# Patient Record
Sex: Female | Born: 2000 | Race: White | Hispanic: No | Marital: Single | Smoking: Never smoker
Health system: Southern US, Community
[De-identification: ages and names within clinical notes are randomized; demographics above are authoritative.]

## PROBLEM LIST (undated history)

## (undated) ENCOUNTER — Inpatient Hospital Stay (HOSPITAL_COMMUNITY): Payer: Self-pay

## (undated) DIAGNOSIS — J039 Acute tonsillitis, unspecified: Secondary | ICD-10-CM

## (undated) DIAGNOSIS — Z3490 Encounter for supervision of normal pregnancy, unspecified, unspecified trimester: Secondary | ICD-10-CM

## (undated) HISTORY — PX: TONSILLECTOMY: SUR1361

---
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
## 2018-01-06 ENCOUNTER — Other Ambulatory Visit (HOSPITAL_COMMUNITY)
Admission: RE | Admit: 2018-01-06 | Discharge: 2018-01-06 | Disposition: A | Discharge status: Home / Self Care | Payer: Medicaid Other | Source: Ambulatory Visit | Attending: Certified Nurse Midwife | Admitting: Certified Nurse Midwife

## 2018-01-06 ENCOUNTER — Ambulatory Visit (INDEPENDENT_AMBULATORY_CARE_PROVIDER_SITE_OTHER): Payer: Medicaid Other | Admitting: Certified Nurse Midwife

## 2018-01-06 ENCOUNTER — Encounter: Payer: Self-pay | Admitting: Certified Nurse Midwife

## 2018-01-06 VITALS — BP 120/76 | HR 86 | Wt 157.0 lb

## 2018-01-06 DIAGNOSIS — Z3A35 35 weeks gestation of pregnancy: Secondary | ICD-10-CM

## 2018-01-06 DIAGNOSIS — Z3483 Encounter for supervision of other normal pregnancy, third trimester: Secondary | ICD-10-CM

## 2018-01-06 DIAGNOSIS — Z3480 Encounter for supervision of other normal pregnancy, unspecified trimester: Secondary | ICD-10-CM | POA: Insufficient documentation

## 2018-01-06 NOTE — Patient Instructions (Signed)
Reasons to go to [facility]U:  1.  Contractions are  4-5 minutes apart or less, each last 1 minute, these have been going on for 1-2 hours, and you cannot walk or talk during them 2.  You have a large gush of fluid, or a trickle of fluid that will not stop and you have to wear a pad 3.  You have bleeding that is bright red, heavier than spotting--like menstrual bleeding (spotting can be normal in early labor or after a check of your cervix) 4.  You do not feel the baby moving like he/she normally does  

## 2018-01-06 NOTE — Progress Notes (Signed)
   PRENATAL VISIT NOTE  Subjective:  Barbara Kennedy is a 17 y.o. G2P1001 at 5969w6d being seen today for ongoing prenatal care.  She is currently monitored for the following issues for this low-risk pregnancy and has Encounter for supervision of normal pregnancy, unspecified, unspecified trimester and MDD (major depressive disorder), single episode, severe , no psychosis (HCC) on their problem list.  Patient reports no complaints.  Contractions: Not present. Vag. Bleeding: None.  Movement: Present. Denies leaking of fluid.   The following portions of the patient's history were reviewed and updated as appropriate: allergies, current medications, past family history, past medical history, past social history, past surgical history and problem list. Problem list updated.  Objective:   Vitals:   01/06/18 1127  BP: 120/76  Pulse: 86  Weight: 157 lb (71.2 kg)    Fetal Status: Fetal Heart Rate (bpm): 140 Fundal Height: 33 cm Movement: Present  Presentation: Vertex  General:  Alert, oriented and cooperative. Patient is in no acute distress.  Skin: Skin is warm and dry. No rash noted.   Cardiovascular: Normal heart rate noted  Respiratory: Normal respiratory effort, no problems with respiration noted  Abdomen: Soft, gravid, appropriate for gestational age.  Pain/Pressure: Present     Pelvic: Cervical exam performed Dilation: 3 Effacement (%): Thick Station: -2  Extremities: Normal range of motion.     Mental Status: Normal mood and affect. Normal behavior. Normal judgment and thought content.   Assessment and Plan:  Pregnancy: G2P1001 at 7069w6d  1. Supervision of other normal pregnancy, antepartum - patient doing well, no complaints - Anticipatory guidance on upcoming appointments  - Labor precautions discussed in detail  - Strep Gp B NAA - Cervicovaginal ancillary only( [place]) - US MFM OB FOLLOW UP; Future  Preterm labor symptoms and general obstetric precautions including but  not limited to vaginal bleeding, contractions, leaking of fluid and fetal movement were reviewed in detail with the patient. Please refer to After Visit Summary for other counseling recommendations.  Return in about 1 week (around 01/13/2018) for ROB.  Future Appointments  Date Time Provider Department Center  01/13/2018 10:30 AM Sharyon Cableogers, Errin Chewning C, CNM [facility] None  01/14/2018  1:15 PM [facility] US 4 [facility]    Sharyon CableVeronica C Franchelle Foskett, CNM

## 2018-01-07 LAB — CERVICOVAGINAL ANCILLARY ONLY
Chlamydia: NEGATIVE
Neisseria Gonorrhea: NEGATIVE

## 2018-01-08 LAB — STREP GP B NAA: Strep Gp B NAA: NEGATIVE

## 2018-01-09 ENCOUNTER — Inpatient Hospital Stay (HOSPITAL_COMMUNITY)
Admission: AD | Admit: 2018-01-09 | Discharge: 2018-01-09 | Disposition: A | Discharge status: Home / Self Care | Payer: Medicaid Other | Source: Ambulatory Visit | Attending: Obstetrics & Gynecology | Admitting: Obstetrics & Gynecology

## 2018-01-09 ENCOUNTER — Encounter (HOSPITAL_COMMUNITY): Payer: Self-pay | Admitting: *Deleted

## 2018-01-09 DIAGNOSIS — O99613 Diseases of the digestive system complicating pregnancy, third trimester: Secondary | ICD-10-CM | POA: Insufficient documentation

## 2018-01-09 DIAGNOSIS — R109 Unspecified abdominal pain: Secondary | ICD-10-CM

## 2018-01-09 DIAGNOSIS — Z3A36 36 weeks gestation of pregnancy: Secondary | ICD-10-CM | POA: Diagnosis not present

## 2018-01-09 DIAGNOSIS — R12 Heartburn: Secondary | ICD-10-CM | POA: Diagnosis not present

## 2018-01-09 DIAGNOSIS — O26893 Other specified pregnancy related conditions, third trimester: Secondary | ICD-10-CM | POA: Diagnosis not present

## 2018-01-09 DIAGNOSIS — O212 Late vomiting of pregnancy: Secondary | ICD-10-CM | POA: Diagnosis present

## 2018-01-09 DIAGNOSIS — R110 Nausea: Secondary | ICD-10-CM

## 2018-01-09 LAB — URINALYSIS, ROUTINE W REFLEX MICROSCOPIC
BILIRUBIN URINE: NEGATIVE
GLUCOSE, UA: NEGATIVE mg/dL
Hgb urine dipstick: NEGATIVE
Ketones, ur: NEGATIVE mg/dL
Nitrite: NEGATIVE
PROTEIN: NEGATIVE mg/dL
Specific Gravity, Urine: 1.026 (ref 1.005–1.030)
pH: 6 (ref 5.0–8.0)

## 2018-01-09 MED ORDER — ONDANSETRON 8 MG PO TBDP
8.0000 mg | ORAL_TABLET | Freq: Once | ORAL | Status: AC
  Administered 2018-01-09: 8 mg via ORAL
  Filled 2018-01-09: qty 1

## 2018-01-09 MED ORDER — PANTOPRAZOLE SODIUM 40 MG PO TBEC: 40.0000 mg | DELAYED_RELEASE_TABLET | Freq: Every day | ORAL | 0 refills | Status: DC

## 2018-01-09 MED ORDER — ONDANSETRON 4 MG PO TBDP: 4.0000 mg | ORAL_TABLET | Freq: Three times a day (TID) | ORAL | 0 refills | Status: DC | PRN

## 2018-01-09 NOTE — Discharge Instructions (Signed)
Heartburn During Pregnancy °Heartburn is a type of pain or discomfort that can happen in the throat or chest. It is often described as a burning sensation. Heartburn is common during pregnancy because: °· A hormone (progesterone) that is released during pregnancy may relax the valve (lower esophageal sphincter, or LES) that separates the esophagus from the stomach. This allows stomach acid to move up into the esophagus, causing heartburn. °· The uterus gets larger and pushes up on the stomach, which pushes more acid into the esophagus. This is especially true in the later stages of pregnancy. ° °Heartburn usually goes away or gets better after giving birth. °What are the causes? °Heartburn is caused by stomach acid backing up into the esophagus (reflux). Reflux can be triggered by: °· Changing hormone levels. °· Large meals. °· Certain foods and beverages, such as coffee, chocolate, onions, and peppermint. °· Exercise. °· Increased stomach acid production. ° °What increases the risk? °You are more likely to experience heartburn during pregnancy if you: °· Had heartburn prior to becoming pregnant. °· Have been pregnant more than once before. °· Are overweight or obese. ° °The likelihood that you will get heartburn also increases as you get farther along in your pregnancy, especially during the last trimester. °What are the signs or symptoms? °Symptoms of this condition include: °· Burning pain in the chest or lower throat. °· Bitter taste in the mouth. °· Coughing. °· Problems swallowing. °· Vomiting. °· Hoarse voice. °· Asthma. ° °Symptoms may get worse when you lie down or bend over. Symptoms are often worse at night. °How is this diagnosed? °This condition is diagnosed based on: °· Your medical history. °· Your symptoms. °· Blood tests to check for a certain type of bacteria associated with heartburn. °· Whether taking heartburn medicine relieves your symptoms. °· Examination of the stomach and esophagus using a  tube with a light and camera on the end (endoscopy). ° °How is this treated? °Treatment varies depending on how severe your symptoms are. Your health care provider may recommend: °· Over-the-counter medicines (antacids or acid reducers) for mild heartburn. °· Prescription medicines to decrease stomach acid or to protect your stomach lining. °· Certain changes in your diet. °· Raising the head of your bed so it is higher than the foot of the bed. This helps prevent stomach acid from backing up into the esophagus when you are lying down. ° °Follow these instructions at home: °Eating and drinking °· Do not drink alcohol during your pregnancy. °· Identify foods and beverages that make your symptoms worse, and avoid them. °· Beverages that you may want to avoid include: °? Coffee and tea (with or without caffeine). °? Energy drinks and sports drinks. °? Carbonated drinks or sodas. °? Citrus fruit juices. °· Foods that you may want to avoid include: °? Chocolate and cocoa. °? Peppermint and mint flavorings. °? Garlic, onions, and horseradish. °? Spicy and acidic foods, including peppers, chili powder, curry powder, vinegar, hot sauces, and barbecue sauce. °? Citrus fruits, such as oranges, lemons, and limes. °? Tomato-based foods, such as red sauce, chili, and salsa. °? Fried and fatty foods, such as donuts, french fries, potato chips, and high-fat dressings. °? High-fat meats, such as hot dogs, cold cuts, sausage, ham, and bacon. °? High-fat dairy items, such as whole milk, butter, and cheese. °· Eat small, frequent meals instead of large meals. °· Avoid drinking large amounts of liquid with your meals. °· Avoid eating meals during the 2-3 hours before   bedtime. °· Avoid lying down right after you eat. °· Do not exercise right after you eat. °Medicines °· Take over-the-counter and prescription medicines only as told by your health care provider. °· Do not take aspirin, ibuprofen, or other NSAIDs unless your health care  provider tells you to do that. °· You may be instructed to avoid medicines that contain sodium bicarbonate. °General instructions °· If directed, raise the head of your bed about 6 inches (15 cm) by putting blocks under the legs. Sleeping with more pillows does not effectively relieve heartburn because it only changes the position of your head. °· Do not use any products that contain nicotine or tobacco, such as cigarettes and e-cigarettes. If you need help quitting, ask your health care provider. °· Wear loose-fitting clothing. °· Try to reduce your stress, such as with yoga or meditation. If you need help managing stress, ask your health care provider. °· Maintain a healthy weight. If you are overweight, work with your health care provider to safely lose weight. °· Keep all follow-up visits as told by your health care provider. This is important. °Contact a health care provider if: °· You develop new symptoms. °· Your symptoms do not improve with treatment. °· You have unexplained weight loss. °· You have difficulty swallowing. °· You make loud sounds when you breathe (wheeze). °· You have a cough that does not go away. °· You have frequent heartburn for more than 2 weeks. °· You have nausea or vomiting that does not get better with treatment. °· You have pain in your abdomen. °Get help right away if: °· You have severe chest pain that spreads to your arm, neck, or jaw. °· You feel sweaty, dizzy, or light-headed. °· You have shortness of breath. °· You have pain when swallowing. °· You vomit, and your vomit looks like blood or coffee grounds. °· Your stool is bloody or black. °This information is not intended to replace advice given to you by your health care provider. Make sure you discuss any questions you have with your health care provider. °Document Released: 01/17/2000 Document Revised: 10/07/2015 Document Reviewed: 10/07/2015 °Elsevier Interactive Patient Education © 2018 [employer] ° °

## 2018-01-09 NOTE — MAU Note (Signed)
Pt here with c/o nausea and vomiting the last two days; upper abdominal cramping. Denies any bleeding or leaking. Reports good fetal movement.

## 2018-01-09 NOTE — MAU Provider Note (Signed)
Chief Complaint:  Nausea and Abdominal Pain   First Provider Initiated Contact with Patient 01/09/18 2028     HPI: Barbara Kennedy is a 17 y.o. G2P1001 at 853w2d who presents to maternity admissions reporting n/v. Symptoms began a few days ago. Reports feeling epigastric burning d/t being hungry, eats, then vomits. Able to keep down fluids. Denies abdominal pain, vaginal bleeding, LOF, fever, or diarrhea. Normal fetal movement. Hasn't treated symptoms. N/v improved PTA without intervention.   Past Medical History:  Diagnosis Date  . Tonsillitis    OB History  Gravida Para Term Preterm AB Living  2 1 1     1   SAB TAB Ectopic Multiple Live Births          1    # Outcome Date GA Lbr Len/2nd Weight Sex Delivery Anes PTL Lv  2 Current           1 Term 10/27/16 7355w0d  3430 g F Vag-Spont   LIV   Past Surgical History:  Procedure Laterality Date  . TONSILLECTOMY     Family History  Problem Relation Age of Onset  . Diabetes Maternal Grandmother    Social History   Tobacco Use  . Smoking status: Never Smoker  . Smokeless tobacco: Never Used  Substance Use Topics  . Alcohol use: No  . Drug use: No   Allergies  Allergen Reactions  . Citric Acid Rash    Around mouth, chin area   Medications Prior to Admission  Medication Sig Dispense Refill Last Dose  . Prenatal MV-Min-FA-Omega-3 (PRENATAL GUMMIES/DHA & FA PO) Take 2 capsules by mouth daily.    01/09/2018 at Unknown time    I have reviewed patient's Past Medical Hx, Surgical Hx, Family Hx, Social Hx, medications and allergies.   ROS:  Review of Systems  Constitutional: Negative.   Gastrointestinal: Positive for abdominal pain (epigastric burning on empty stomach), nausea and vomiting. Negative for constipation and diarrhea.  Genitourinary: Negative.     Physical Exam   Patient Vitals for the past 24 hrs:  BP Temp Temp src Pulse Resp SpO2 Height Weight  01/09/18 2156 119/64 - - 83 16 - - -  01/09/18 1930 126/85 98 F  (36.7 C) Oral 81 18 100 % 5\' 1"  (1.549 m) 68.5 kg    Constitutional: Well-developed, well-nourished female in no acute distress.  Cardiovascular: normal rate & rhythm, no murmur Respiratory: normal effort, lung sounds clear throughout GI: Abd soft, non-tender, gravid appropriate for gestational age. Pos BS x 4 MS: Extremities nontender, no edema, normal ROM Neurologic: Alert and oriented x 4.   NST:  Baseline: 130 bpm, Variability: Good {> 6 bpm), Accelerations: Reactive and Decelerations: Absent   Labs: Results for orders placed or performed during the hospital encounter of 01/09/18 (from the past 24 hour(s))  Urinalysis, Routine w reflex microscopic     Status: Abnormal   Collection Time: 01/09/18  7:38 PM  Result Value Ref Range   Color, Urine AMBER (A) YELLOW   APPearance CLEAR CLEAR   Specific Gravity, Urine 1.026 1.005 - 1.030   pH 6.0 5.0 - 8.0   Glucose, UA NEGATIVE NEGATIVE mg/dL   Hgb urine dipstick NEGATIVE NEGATIVE   Bilirubin Urine NEGATIVE NEGATIVE   Ketones, ur NEGATIVE NEGATIVE mg/dL   Protein, ur NEGATIVE NEGATIVE mg/dL   Nitrite NEGATIVE NEGATIVE   Leukocytes, UA TRACE (A) NEGATIVE   RBC / HPF 0-5 0 - 5 RBC/hpf   WBC, UA 0-5 0 - 5  WBC/hpf   Bacteria, UA RARE (A) NONE SEEN   Squamous Epithelial / LPF 0-5 0 - 5   Mucus PRESENT     Imaging:  No results found.  MAU Course: Orders Placed This Encounter  Procedures  . Urinalysis, Routine w reflex microscopic  . Discharge patient   Meds ordered this encounter  Medications  . ondansetron (ZOFRAN-ODT) disintegrating tablet 8 mg  . pantoprazole (PROTONIX) 40 MG tablet    Sig: Take 1 tablet (40 mg total) by mouth daily.    Dispense:  30 tablet    Refill:  0    Order Specific Question:   Supervising Provider    Answer:   Despina Hidden, LUTHER H [[id number]]  . ondansetron (ZOFRAN ODT) 4 MG disintegrating tablet    Sig: Take 1 tablet (4 mg total) by mouth every 8 (eight) hours as needed for nausea or vomiting.     Dispense:  15 tablet    Refill:  0    Order Specific Question:   Supervising Provider    Answer:   Lazaro Arms [[id number]]    MDM: Zofran given. Pt able to tolerate POs. Will discharge home with meds.   Assessment: 1. Heartburn during pregnancy in third trimester   2. [redacted] weeks gestation of pregnancy     Plan: Discharge home in stable condition.  Labor precautions and fetal kick counts Rx protonix & zofran   Allergies as of 01/09/2018      Reactions   Citric Acid Rash   Around mouth, chin area      Medication List    TAKE these medications   ondansetron 4 MG disintegrating tablet Commonly known as:  ZOFRAN-ODT Take 1 tablet (4 mg total) by mouth every 8 (eight) hours as needed for nausea or vomiting.   pantoprazole 40 MG tablet Commonly known as:  PROTONIX Take 1 tablet (40 mg total) by mouth daily.   PRENATAL GUMMIES/DHA & FA PO Take 2 capsules by mouth daily.       Barbara Horn, NP 01/09/2018 9:51 PM

## 2018-01-13 ENCOUNTER — Ambulatory Visit (INDEPENDENT_AMBULATORY_CARE_PROVIDER_SITE_OTHER): Payer: Medicaid Other | Admitting: Certified Nurse Midwife

## 2018-01-13 ENCOUNTER — Encounter: Payer: Self-pay | Admitting: Certified Nurse Midwife

## 2018-01-13 VITALS — BP 114/74 | HR 73 | Wt 154.2 lb

## 2018-01-13 DIAGNOSIS — Z3480 Encounter for supervision of other normal pregnancy, unspecified trimester: Secondary | ICD-10-CM

## 2018-01-13 DIAGNOSIS — Z3483 Encounter for supervision of other normal pregnancy, third trimester: Secondary | ICD-10-CM

## 2018-01-13 NOTE — Progress Notes (Signed)
   PRENATAL VISIT NOTE  Subjective:  Barbara Kennedy is a 17 y.o. G2P1001 at [redacted]w[redacted]d being seen today for ongoing prenatal care.  She is currently monitored for the following issues for this low-risk pregnancy and has Encounter for supervision of normal pregnancy, unspecified, unspecified trimester and MDD (major depressive disorder), single episode, severe , no psychosis (HCC) on their problem list.  Patient reports no complaints.  Contractions: Not present. Vag. Bleeding: None.  Movement: Present. Denies leaking of fluid.   The following portions of the patient's history were reviewed and updated as appropriate: allergies, current medications, past family history, past medical history, past social history, past surgical history and problem list. Problem list updated.  Objective:   Vitals:   01/13/18 1029  BP: 114/74  Pulse: 73  Weight: 154 lb 3.2 oz (69.9 kg)    Fetal Status: Fetal Heart Rate (bpm): 150 Fundal Height: 33 cm Movement: Present     General:  Alert, oriented and cooperative. Patient is in no acute distress.  Skin: Skin is warm and dry. No rash noted.   Cardiovascular: Normal heart rate noted  Respiratory: Normal respiratory effort, no problems with respiration noted  Abdomen: Soft, gravid, appropriate for gestational age.  Pain/Pressure: Absent     Pelvic: Cervical exam deferred        Extremities: Normal range of motion.  Edema: None  Mental Status: Normal mood and affect. Normal behavior. Normal judgment and thought content.   Assessment and Plan:  Pregnancy: G2P1001 at [redacted]w[redacted]d  1. Supervision of other normal pregnancy, antepartum - Patient doing well, no complaints - Anticipatory guidance on upcoming appointments   2. Small for gestational age (SGA) - SGA vs IUGR, watch FH closely  - Follow up US scheduled for 12/13   Term labor symptoms and general obstetric precautions including but not limited to vaginal bleeding, contractions, leaking of fluid and fetal  movement were reviewed in detail with the patient. Please refer to After Visit Summary for other counseling recommendations.  Return in about 1 week (around 01/20/2018) for ROB.  Future Appointments  Date Time Provider Department Center  01/14/2018  1:15 PM [facility] US 4 [facility]  01/20/2018  9:30 AM Sharyon Cableogers, Breshay Ilg C, CNM [facility] None    Sharyon CableVeronica C Jamol Ginyard, CNM

## 2018-01-13 NOTE — Patient Instructions (Signed)
Reasons to go to MAU:  1.  Contractions are  5 minutes apart or less, each last 1 minute, these have been going on for 1-2 hours, and you cannot walk or talk during them 2.  You have a large gush of fluid, or a trickle of fluid that will not stop and you have to wear a pad 3.  You have bleeding that is bright red, heavier than spotting--like menstrual bleeding (spotting can be normal in early labor or after a check of your cervix) 4.  You do not feel the baby moving like he/she normally does  

## 2018-01-13 NOTE — Progress Notes (Signed)
Pt is here for ROB G2P1 [redacted]w[redacted]d.  

## 2018-01-14 ENCOUNTER — Ambulatory Visit (HOSPITAL_COMMUNITY)
Admission: RE | Admit: 2018-01-14 | Discharge: 2018-01-14 | Disposition: A | Discharge status: Home / Self Care | Payer: Medicaid Other | Source: Ambulatory Visit | Attending: Certified Nurse Midwife | Admitting: Certified Nurse Midwife

## 2018-01-14 DIAGNOSIS — O26843 Uterine size-date discrepancy, third trimester: Secondary | ICD-10-CM

## 2018-01-14 DIAGNOSIS — Z3480 Encounter for supervision of other normal pregnancy, unspecified trimester: Secondary | ICD-10-CM

## 2018-01-14 DIAGNOSIS — Z3A37 37 weeks gestation of pregnancy: Secondary | ICD-10-CM | POA: Diagnosis not present

## 2018-01-14 DIAGNOSIS — Z3483 Encounter for supervision of other normal pregnancy, third trimester: Secondary | ICD-10-CM | POA: Insufficient documentation

## 2018-01-14 NOTE — Progress Notes (Signed)
Subjective: Barbara Kennedy is a G2P1001 at 6150w0d who [facility] for ob [facility].  She does not have a history of any mental health concerns. She is currently sexually active. She is currently using no method for birth control. She has had recent STD screening on 01/06/18 and 11/12/2017 and was tested for HIV, GC and Chlamydia  Patient states family as her support system.   BP 114/74   Pulse 73   Wt 154 lb 3.2 oz (69.9 kg)   LMP 04/30/2017   BMI 29.14 kg/m   Birth Control History:  None   MDM Patient counseled on all options for birth control today including LARC. Patient desires PP IUD initiated for birth control.   Assessment:  17 y.o. female considering PP IUD for birth control  Plan: Patient will receive PP IUD before discharge  Gwyndolyn SaxonFigueroa, Kaylianna Detert, Alexander MtLCSW 01/14/2018 9:41 AM

## 2018-01-20 ENCOUNTER — Encounter: Payer: Self-pay | Admitting: Certified Nurse Midwife

## 2018-01-20 ENCOUNTER — Ambulatory Visit (INDEPENDENT_AMBULATORY_CARE_PROVIDER_SITE_OTHER): Payer: Medicaid Other | Admitting: Certified Nurse Midwife

## 2018-01-20 VITALS — BP 123/83 | HR 99 | Wt 156.8 lb

## 2018-01-20 DIAGNOSIS — Z3480 Encounter for supervision of other normal pregnancy, unspecified trimester: Secondary | ICD-10-CM

## 2018-01-20 DIAGNOSIS — Z3483 Encounter for supervision of other normal pregnancy, third trimester: Secondary | ICD-10-CM

## 2018-01-20 NOTE — Patient Instructions (Signed)
Reasons to go to MAU:  1.  Contractions are  5 minutes apart or less, each last 1 minute, these have been going on for 1-2 hours, and you cannot walk or talk during them 2.  You have a large gush of fluid, or a trickle of fluid that will not stop and you have to wear a pad 3.  You have bleeding that is bright red, heavier than spotting--like menstrual bleeding (spotting can be normal in early labor or after a check of your cervix) 4.  You do not feel the baby moving like he/she normally does  

## 2018-01-20 NOTE — Progress Notes (Signed)
   PRENATAL VISIT NOTE  Subjective:  Barbara Kennedy is a 17 y.o. G2P1001 at 188w6d being seen today for ongoing prenatal care.  She is currently monitored for the following issues for this low-risk pregnancy and has Encounter for supervision of normal pregnancy, unspecified, unspecified trimester and MDD (major depressive disorder), single episode, severe , no psychosis (HCC) on their problem list.  Patient reports no complaints.  Contractions: Not present. Vag. Bleeding: None.  Movement: Present. Denies leaking of fluid.   The following portions of the patient's history were reviewed and updated as appropriate: allergies, current medications, past family history, past medical history, past social history, past surgical history and problem list. Problem list updated.  Objective:   Vitals:   01/20/18 0942  BP: 123/83  Pulse: 99  Weight: 156 lb 12.8 oz (71.1 kg)    Fetal Status: Fetal Heart Rate (bpm): 136 Fundal Height: 34 cm Movement: Present     General:  Alert, oriented and cooperative. Patient is in no acute distress.  Skin: Skin is warm and dry. No rash noted.   Cardiovascular: Normal heart rate noted  Respiratory: Normal respiratory effort, no problems with respiration noted  Abdomen: Soft, gravid, appropriate for gestational age.  Pain/Pressure: Absent     Pelvic: Cervical exam deferred        Extremities: Normal range of motion.  Edema: None  Mental Status: Normal mood and affect. Normal behavior. Normal judgment and thought content.   Assessment and Plan:  Pregnancy: G2P1001 at 3088w6d  1. Supervision of other normal pregnancy, antepartum - patient doing well, no complaints  - anticipatory guidance on upcoming appointments  - labor precautions  - growth US completed on 12/13 at 37 weeks: Est. FW: 2645  gm  5 lb 13 oz      31  %   Term labor symptoms and general obstetric precautions including but not limited to vaginal bleeding, contractions, leaking of fluid and fetal  movement were reviewed in detail with the patient. Please refer to After Visit Summary for other counseling recommendations.  Return in about 1 week (around 01/27/2018) for ROB.  Future Appointments  Date Time Provider Department Center  01/31/2018  2:30 PM Leftwich-Kirby, Wilmer FloorLisa A, CNM [facility] None    Sharyon CableVeronica C [place] Troia, CNM

## 2018-01-21 ENCOUNTER — Inpatient Hospital Stay (HOSPITAL_COMMUNITY)
Admission: AD | Admit: 2018-01-21 | Discharge: 2018-01-22 | DRG: 807 | Disposition: A | Discharge status: Home / Self Care | Payer: Medicaid Other | Attending: Family Medicine | Admitting: Family Medicine

## 2018-01-21 ENCOUNTER — Inpatient Hospital Stay (HOSPITAL_COMMUNITY): Payer: Medicaid Other | Admitting: Anesthesiology

## 2018-01-21 ENCOUNTER — Encounter (HOSPITAL_COMMUNITY): Payer: Self-pay | Admitting: *Deleted

## 2018-01-21 ENCOUNTER — Other Ambulatory Visit: Payer: Self-pay

## 2018-01-21 DIAGNOSIS — O99344 Other mental disorders complicating childbirth: Secondary | ICD-10-CM | POA: Diagnosis present

## 2018-01-21 DIAGNOSIS — Z30430 Encounter for insertion of intrauterine contraceptive device: Secondary | ICD-10-CM | POA: Diagnosis not present

## 2018-01-21 DIAGNOSIS — F322 Major depressive disorder, single episode, severe without psychotic features: Secondary | ICD-10-CM | POA: Diagnosis present

## 2018-01-21 DIAGNOSIS — F329 Major depressive disorder, single episode, unspecified: Secondary | ICD-10-CM | POA: Diagnosis present

## 2018-01-21 DIAGNOSIS — Z3A38 38 weeks gestation of pregnancy: Secondary | ICD-10-CM

## 2018-01-21 DIAGNOSIS — Z3483 Encounter for supervision of other normal pregnancy, third trimester: Secondary | ICD-10-CM | POA: Diagnosis present

## 2018-01-21 DIAGNOSIS — O4202 Full-term premature rupture of membranes, onset of labor within 24 hours of rupture: Secondary | ICD-10-CM | POA: Diagnosis not present

## 2018-01-21 LAB — RPR: RPR Ser Ql: NONREACTIVE

## 2018-01-21 LAB — TYPE AND SCREEN
ABO/RH(D): A POS
Antibody Screen: NEGATIVE

## 2018-01-21 LAB — CBC
HCT: 34.3 % — ABNORMAL LOW (ref 36.0–46.0)
Hemoglobin: 10.5 g/dL — ABNORMAL LOW (ref 12.0–15.0)
MCH: 24.5 pg — ABNORMAL LOW (ref 26.0–34.0)
MCHC: 30.6 g/dL (ref 30.0–36.0)
MCV: 80.1 fL (ref 80.0–100.0)
Platelets: 270 10*3/uL (ref 150–400)
RBC: 4.28 MIL/uL (ref 3.87–5.11)
RDW: 14.4 % (ref 11.5–15.5)
WBC: 7.9 10*3/uL (ref 4.0–10.5)
nRBC: 0.4 % — ABNORMAL HIGH (ref 0.0–0.2)

## 2018-01-21 LAB — ABO/RH: ABO/RH(D): A POS

## 2018-01-21 MED ORDER — OXYCODONE-ACETAMINOPHEN 5-325 MG PO TABS: 2.0000 | ORAL_TABLET | ORAL | Status: DC | PRN

## 2018-01-21 MED ORDER — ZOLPIDEM TARTRATE 5 MG PO TABS: 5.0000 mg | ORAL_TABLET | Freq: Every evening | ORAL | Status: DC | PRN

## 2018-01-21 MED ORDER — LIDOCAINE HCL (PF) 1 % IJ SOLN
INTRAMUSCULAR | Status: DC | PRN
  Administered 2018-01-21 (×2): 5 mL via EPIDURAL

## 2018-01-21 MED ORDER — LIDOCAINE HCL (PF) 1 % IJ SOLN
30.0000 mL | INTRAMUSCULAR | Status: DC | PRN
  Filled 2018-01-21: qty 30

## 2018-01-21 MED ORDER — FENTANYL CITRATE (PF) 100 MCG/2ML IJ SOLN: 100.0000 ug | Freq: Once | INTRAMUSCULAR | Status: DC

## 2018-01-21 MED ORDER — DIPHENHYDRAMINE HCL 50 MG/ML IJ SOLN: 12.5000 mg | INTRAMUSCULAR | Status: DC | PRN

## 2018-01-21 MED ORDER — COCONUT OIL OIL: 1.0000 "application " | TOPICAL_OIL | Status: DC | PRN

## 2018-01-21 MED ORDER — ACETAMINOPHEN 325 MG PO TABS: 650.0000 mg | ORAL_TABLET | ORAL | Status: DC | PRN

## 2018-01-21 MED ORDER — LACTATED RINGERS IV SOLN: 500.0000 mL | Freq: Once | INTRAVENOUS | Status: DC

## 2018-01-21 MED ORDER — BENZOCAINE-MENTHOL 20-0.5 % EX AERO: 1.0000 "application " | INHALATION_SPRAY | CUTANEOUS | Status: DC | PRN

## 2018-01-21 MED ORDER — FENTANYL 2.5 MCG/ML BUPIVACAINE 1/10 % EPIDURAL INFUSION (WH - ANES)
14.0000 mL/h | INTRAMUSCULAR | Status: DC | PRN
  Administered 2018-01-21: 14 mL/h via EPIDURAL

## 2018-01-21 MED ORDER — FENTANYL 2.5 MCG/ML BUPIVACAINE 1/10 % EPIDURAL INFUSION (WH - ANES)
INTRAMUSCULAR | Status: AC
  Filled 2018-01-21: qty 100

## 2018-01-21 MED ORDER — ONDANSETRON HCL 4 MG PO TABS: 4.0000 mg | ORAL_TABLET | ORAL | Status: DC | PRN

## 2018-01-21 MED ORDER — OXYCODONE-ACETAMINOPHEN 5-325 MG PO TABS: 1.0000 | ORAL_TABLET | ORAL | Status: DC | PRN

## 2018-01-21 MED ORDER — ONDANSETRON HCL 4 MG/2ML IJ SOLN: 4.0000 mg | Freq: Four times a day (QID) | INTRAMUSCULAR | Status: DC | PRN

## 2018-01-21 MED ORDER — PHENYLEPHRINE 40 MCG/ML (10ML) SYRINGE FOR IV PUSH (FOR BLOOD PRESSURE SUPPORT)
PREFILLED_SYRINGE | INTRAVENOUS | Status: AC
  Filled 2018-01-21: qty 20

## 2018-01-21 MED ORDER — DIPHENHYDRAMINE HCL 25 MG PO CAPS: 25.0000 mg | ORAL_CAPSULE | Freq: Four times a day (QID) | ORAL | Status: DC | PRN

## 2018-01-21 MED ORDER — PRENATAL MULTIVITAMIN CH
1.0000 | ORAL_TABLET | Freq: Every day | ORAL | Status: DC
  Administered 2018-01-21 – 2018-01-22 (×2): 1 via ORAL
  Filled 2018-01-21 (×2): qty 1

## 2018-01-21 MED ORDER — PHENYLEPHRINE 40 MCG/ML (10ML) SYRINGE FOR IV PUSH (FOR BLOOD PRESSURE SUPPORT)
80.0000 ug | PREFILLED_SYRINGE | INTRAVENOUS | Status: DC | PRN
  Filled 2018-01-21: qty 10

## 2018-01-21 MED ORDER — LEVONORGESTREL 19.5 MCG/DAY IU IUD
INTRAUTERINE_SYSTEM | Freq: Once | INTRAUTERINE | Status: DC
  Filled 2018-01-21: qty 1

## 2018-01-21 MED ORDER — LACTATED RINGERS IV SOLN
INTRAVENOUS | Status: DC
  Administered 2018-01-21 (×2): via INTRAVENOUS

## 2018-01-21 MED ORDER — OXYTOCIN 40 UNITS IN LACTATED RINGERS INFUSION - SIMPLE MED
2.5000 [IU]/h | INTRAVENOUS | Status: DC
  Administered 2018-01-21: 2.5 [IU]/h via INTRAVENOUS
  Filled 2018-01-21: qty 1000

## 2018-01-21 MED ORDER — WITCH HAZEL-GLYCERIN EX PADS: 1.0000 "application " | MEDICATED_PAD | CUTANEOUS | Status: DC | PRN

## 2018-01-21 MED ORDER — EPHEDRINE 5 MG/ML INJ
10.0000 mg | INTRAVENOUS | Status: DC | PRN
  Filled 2018-01-21: qty 2

## 2018-01-21 MED ORDER — SIMETHICONE 80 MG PO CHEW: 80.0000 mg | CHEWABLE_TABLET | ORAL | Status: DC | PRN

## 2018-01-21 MED ORDER — IBUPROFEN 600 MG PO TABS
600.0000 mg | ORAL_TABLET | Freq: Four times a day (QID) | ORAL | Status: DC
  Administered 2018-01-21 – 2018-01-22 (×4): 600 mg via ORAL
  Filled 2018-01-21 (×5): qty 1

## 2018-01-21 MED ORDER — ONDANSETRON HCL 4 MG/2ML IJ SOLN: 4.0000 mg | INTRAMUSCULAR | Status: DC | PRN

## 2018-01-21 MED ORDER — LACTATED RINGERS IV SOLN: 500.0000 mL | INTRAVENOUS | Status: DC | PRN

## 2018-01-21 MED ORDER — OXYTOCIN BOLUS FROM INFUSION
500.0000 mL | Freq: Once | INTRAVENOUS | Status: AC
  Administered 2018-01-21: 500 mL via INTRAVENOUS

## 2018-01-21 MED ORDER — SENNOSIDES-DOCUSATE SODIUM 8.6-50 MG PO TABS
2.0000 | ORAL_TABLET | ORAL | Status: DC
  Administered 2018-01-21: 2 via ORAL
  Filled 2018-01-21: qty 2

## 2018-01-21 MED ORDER — DIBUCAINE 1 % RE OINT: 1.0000 "application " | TOPICAL_OINTMENT | RECTAL | Status: DC | PRN

## 2018-01-21 NOTE — Procedures (Signed)
Pt desires a postplacental Liletta IUD.  The risks and benefits of the method and placement have been thouroughly reviewed with the patient and all questions were answered.  Specifically the patient is aware of failure rate of 02/998, expulsion of the IUD and of possible perforation.  The patient is aware of irregular bleeding due to the method and understands the incidence of irregular bleeding diminishes with time.  Time out was performed.  The cervix was grasped manually and the IUD was inserted to the uterine fundus.  Strings trimmed to about 10cms. .  The patient was instructed on signs and symptoms of infection and to check for the strings after each menses or each month.   Barbara Kennedy 01/21/2018 8:50 AM

## 2018-01-21 NOTE — H&P (Addendum)
LABOR AND DELIVERY ADMISSION HISTORY AND PHYSICAL NOTE  Barbara Kennedy is a 17 y.o. female G2P1001 with IUP at 7857w0d presenting for SOL.   She has not felt the baby move since she woke up.   [facility] Pregnancy complications:  - Major Depressive Disorder (single severe episode, no psychosis) - Short interval between pregnancies (last delivery 9/18)  Past Medical History: Past Medical History:  Diagnosis Date  . Tonsillitis     Past Surgical History: Past Surgical History:  Procedure Laterality Date  . TONSILLECTOMY      Obstetrical History: OB History    Gravida  2   Para  1   Term  1   Preterm      AB      Living  1     SAB      TAB      Ectopic      Multiple      Live Births  1           Social History: Social History   Socioeconomic History  . Marital status: Single    Spouse name: Not on file  . Number of children: Not on file  . Years of education: Not on file  . Highest education level: Not on file  Occupational History  . Not on file  Social Needs  . Financial resource strain: Not on file  . Food insecurity:    Worry: Not on file    Inability: Not on file  . Transportation needs:    Medical: Not on file    Non-medical: Not on file  Tobacco Use  . Smoking status: Never Smoker  . Smokeless tobacco: Never Used  Substance and Sexual Activity  . Alcohol use: No  . Drug use: No  . Sexual activity: Not Currently    Partners: Male    Birth control/protection: None    Comment: at pregnancy  Lifestyle  . Physical activity:    Days per week: Not on file    Minutes per session: Not on file  . Stress: Not on file  Relationships  . Social connections:    Talks on phone: Not on file    Gets together: Not on file    Attends religious service: Not on file    Active member of club or organization: Not on file    Attends meetings of clubs or organizations: Not on file    Relationship status: Not  on file  Other Topics Concern  . Not on file  Social History Narrative  . Not on file    Family History: Family History  Problem Relation Age of Onset  . Diabetes Maternal Grandmother     Allergies: Allergies  Allergen Reactions  . Citric Acid Rash    Around mouth, chin area    Medications Prior to Admission  Medication Sig Dispense Refill Last Dose  . Prenatal MV-Min-FA-Omega-3 (PRENATAL GUMMIES/DHA & FA PO) Take 2 capsules by mouth daily.    01/20/2018 at Unknown time  . ondansetron (ZOFRAN ODT) 4 MG disintegrating tablet Take 1 tablet (4 mg total) by mouth every 8 (eight) hours as needed for nausea or vomiting. (Patient not taking: Reported on 01/13/2018) 15 tablet 0 Not Taking  . pantoprazole (PROTONIX) 40 MG tablet Take 1 tablet (40 mg total) by mouth daily. (Patient not taking: Reported on 01/13/2018) 30 tablet 0 Not Taking     Review of Systems  All systems reviewed and negative except as  stated in HPI  Physical Exam Blood pressure 136/88, pulse 81, last menstrual period 04/30/2017, not currently breastfeeding. General appearance: alert, oriented, NAD Lungs: normal respiratory effort Heart: regular rate Abdomen: soft, non-tender; gravid, FH appropriate for GA Extremities: No calf swelling or tenderness Presentation: Vertex Fetal monitoring: Baseline HR 135, 15x15 Uterine activity: q2-5027m, 40-60s duration Dilation: 5.5 Effacement (%): 80 Station: -2 Exam by:: Lauren Cox and Everlene FarrierHilary Hardy RN   Prenatal labs: ABO, Rh: A/Positive/-- (08/14 1538) Antibody: Negative (08/14 1538) Rubella: 3.51 (08/14 1538) RPR: Non Reactive (10/11 1130)  HBsAg: Negative (08/14 1538)  HIV: Non Reactive (10/11 1129)  GC/Chlamydia: Neg 01/06/18 GBS: Negative (12/05 1652)  2-hr GTT: 86 Genetic screening: CF neg, SNM1: "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 This result reduces but does not eliminate the risk to be a carrier of SMA." Anatomy US: 01/14/18: Normal amniotic  fluid volume, "femur length is <3rd percentile, this is most likely consitutional and NOT pathological."   Prenatal Transfer Tool  Maternal Diabetes: No Genetic Screening: Normal Maternal Ultrasounds/Referrals: Normal "femur length is <3rd percentile, this is most likely consitutional and NOT pathological."  Fetal Ultrasounds or other Referrals:  None Maternal Substance Abuse:  No Significant Maternal Medications:  Meds include: Protonix Other: Zofran, Prenatal Vit Significant Maternal Lab Results: None  No results found for this or any previous visit (from the past 24 hour(s)).  Patient Active Problem List   Diagnosis Date Noted  . Encounter for supervision of normal pregnancy, unspecified, unspecified trimester 09/15/2017  . MDD (major depressive disorder), single episode, severe , no psychosis (HCC) 04/05/2015    Assessment: Barbara Kennedy is a 17 y.o. G2P1001 at 6184w0d here for SOL.  #Labor: SOL, expectant management #Pain: Fetanyl #FWB: Category 1 #ID: GBS neg 01/06/18  #MOF: Breast #MOC: IUD #Circ: Yes if boy  Trenda Mootslizabeth Barefoot MS3 01/21/2018, 3:04 AM   I have seen this patient and agree with the student's documentation. I have examined them separately, and we have discussed the plan of care.  Gabor Lusk L. Zachery ConchFriedman, MD OB/GYN Fellow

## 2018-01-21 NOTE — Lactation Note (Signed)
This note was copied from a baby's chart. Lactation Consultation Note  Patient Name: Barbara Kennedy LUCAS [id number] Today's Date: 01/21/2018 Reason for consult: Initial assessment;Early term 4137-38.6wks  12 hours old FT female who is being exclusively BF by his mother, she's a P2 and has some experience BF. She was able to BF her first baby for 4 weeks but faced some BF difficulties, baby kept hurting her, mom got really sore nipples. She has a DEBP at home.  Baby was asleep when entering the room, offered assistance with latch but mom politely declined stating she had already fed baby some breastmilk with the help of Corrie DandyMary, her RN, Corrie DandyMary has been very proactive and helping mom with hand expression, mom spoon feed baby 20 ml of breastmilk on his last feeding. Asked mom to call for assistance whenever is needed.  Mom's main concern is that baby is not showing cues and he keeps spitting up amniotic fluid, explained to mom normal newborn behavior, discussed cluster feeding. Discussed cluster feeding.  Feeding plan:  1. Encouraged mom to feed baby STS 8-12 times/24 hours or sooner if feeding cues are present 2. Hand expression and spoon feeding was also encouraged, praised mom for her efforts  BF brochure, BF resources and feeding diary were reviewed. Mom reported all questions and concerns were answered, she's aware of LC services and will call PRN.  Maternal Data Formula Feeding for Exclusion: No Has patient been taught Hand Expression?: Yes Does the patient have breastfeeding experience prior to this delivery?: Yes  Feeding   Interventions Interventions: Breast feeding basics reviewed  Lactation Tools Discussed/Used WIC Program: No   Consult Status Consult Status: Follow-up Date: 01/22/18 Follow-up type: In-patient    Amyria Komar Venetia ConstableS Alynna Hargrove 01/21/2018, 7:13 PM

## 2018-01-21 NOTE — Anesthesia Postprocedure Evaluation (Signed)
Anesthesia Post Note  Patient: Barbara Kennedy  Procedure(s) Performed: AN AD HOC LABOR EPIDURAL     Patient location during evaluation: Mother Baby Anesthesia Type: Epidural Level of consciousness: awake and alert and oriented Pain management: satisfactory to patient Vital Signs Assessment: post-procedure vital signs reviewed and stable Respiratory status: respiratory function stable Cardiovascular status: stable Postop Assessment: no headache, no backache, epidural receding, patient able to bend at knees, no signs of nausea or vomiting and adequate PO intake Anesthetic complications: no    Last Vitals:  Vitals:   01/21/18 1000 01/21/18 1400  BP: 103/60 100/61  Pulse: 62 68  Resp: 20 20  Temp: 36.9 C 36.8 C  SpO2: 99% 100%    Last Pain:  Vitals:   01/21/18 1400  TempSrc:   PainSc: 0-No pain   Pain Goal: Patients Stated Pain Goal: 4 (01/21/18 0900)               Karleen DolphinFUSSELL,Jennifer Holland

## 2018-01-21 NOTE — Anesthesia Preprocedure Evaluation (Signed)
Anesthesia Evaluation  Patient identified by MRN, date of birth, ID band Patient awake    Reviewed: Allergy & Precautions, H&P , NPO status , Patient's Chart, lab work & pertinent test results  Airway Mallampati: I   Neck ROM: full    Dental   Pulmonary neg pulmonary ROS,    breath sounds clear to auscultation       Cardiovascular negative cardio ROS   Rhythm:regular Rate:Normal     Neuro/Psych    GI/Hepatic   Endo/Other    Renal/GU      Musculoskeletal   Abdominal   Peds  Hematology   Anesthesia Other Findings   Reproductive/Obstetrics (+) Pregnancy                             Anesthesia Physical Anesthesia Plan  ASA: I  Anesthesia Plan: Epidural   Post-op Pain Management:    Induction: Intravenous  PONV Risk Score and Plan: 2 and Treatment may vary due to age or medical condition  Airway Management Planned: Natural Airway  Additional Equipment:   Intra-op Plan:   Post-operative Plan:   Informed Consent: I have reviewed the patients History and Physical, chart, labs and discussed the procedure including the risks, benefits and alternatives for the proposed anesthesia with the patient or authorized representative who has indicated his/her understanding and acceptance.       Plan Discussed with: Anesthesiologist  Anesthesia Plan Comments:         Anesthesia Quick Evaluation  

## 2018-01-21 NOTE — MAU Note (Signed)
Pt reports to MAU c/o ctx every 3-315min, no bleeding or LOF. Pt states she has not felt the baby move since she has been awake.

## 2018-01-21 NOTE — Anesthesia Procedure Notes (Signed)
Epidural Patient location during procedure: OB Start time: 01/21/2018 3:51 AM End time: 01/21/2018 4:00 AM  Staffing Anesthesiologist: Achille RichHodierne, Henrine Hayter, MD Performed: anesthesiologist   Preanesthetic Checklist Completed: patient identified, site marked, pre-op evaluation, timeout performed, IV checked, risks and benefits discussed and monitors and equipment checked  Epidural Patient position: sitting Prep: DuraPrep Patient monitoring: heart rate, cardiac monitor, continuous pulse ox and blood pressure Approach: midline Location: L2-L3 Injection technique: LOR saline  Needle:  Needle type: Tuohy  Needle gauge: 17 G Needle length: 9 cm Needle insertion depth: 5 cm Catheter type: closed end flexible Catheter size: 19 Gauge Catheter at skin depth: 11 cm Test dose: negative and Other  Assessment Events: blood not aspirated, injection not painful, no injection resistance and negative IV test  Additional Notes Informed consent obtained prior to proceeding including risk of failure, 1% risk of PDPH, risk of minor discomfort and bruising.  Discussed rare but serious complications including epidural abscess, permanent nerve injury, epidural hematoma.  Discussed alternatives to epidural analgesia and patient desires to proceed.  Timeout performed pre-procedure verifying patient name, procedure, and platelet count.  Patient tolerated procedure well. Reason for block:procedure for pain

## 2018-01-22 MED ORDER — IBUPROFEN 600 MG PO TABS: 600.0000 mg | ORAL_TABLET | Freq: Four times a day (QID) | ORAL | 0 refills | Status: DC | PRN

## 2018-01-22 NOTE — Discharge Instructions (Signed)
Vaginal Delivery, Care After °Refer to this sheet in the next few weeks. These instructions provide you with information about caring for yourself after vaginal delivery. Your health care provider may also give you more specific instructions. Your treatment has been planned according to current medical practices, but problems sometimes occur. Call your health care provider if you have any problems or questions. °What can I expect after the procedure? °After vaginal delivery, it is common to have: °· Some bleeding from your vagina. °· Soreness in your abdomen, your vagina, and the area of skin between your vaginal opening and your anus (perineum). °· Pelvic cramps. °· Fatigue. °Follow these instructions at home: °Medicines °· Take over-the-counter and prescription medicines only as told by your health care provider. °· If you were prescribed an antibiotic medicine, take it as told by your health care provider. Do not stop taking the antibiotic until it is finished. °Driving ° °· Do not drive or operate heavy machinery while taking prescription pain medicine. °· Do not drive for 24 hours if you received a sedative. °Lifestyle °· Do not drink alcohol. This is especially important if you are breastfeeding or taking medicine to relieve pain. °· Do not use tobacco products, including cigarettes, chewing tobacco, or e-cigarettes. If you need help quitting, ask your health care provider. °Eating and drinking °· Drink at least 8 eight-ounce glasses of water every day unless you are told not to by your health care provider. If you choose to breastfeed your baby, you may need to drink more water than this. °· Eat high-fiber foods every day. These foods may help prevent or relieve constipation. High-fiber foods include: °? Whole grain cereals and breads. °? Brown rice. °? Beans. °? Fresh fruits and vegetables. °Activity °· Return to your normal activities as told by your health care provider. Ask your health care provider what  activities are safe for you. °· Rest as much as possible. Try to rest or take a nap when your baby is sleeping. °· Do not lift anything that is heavier than your baby or 10 lb (4.5 kg) until your health care provider says that it is safe. °· Talk with your health care provider about when you can engage in sexual activity. This may depend on your: °? Risk of infection. °? Rate of healing. °? Comfort and desire to engage in sexual activity. °Vaginal Care °· If you have an episiotomy or a vaginal tear, check the area every day for signs of infection. Check for: °? More redness, swelling, or pain. °? More fluid or blood. °? Warmth. °? Pus or a bad smell. °· Do not use tampons or douches until your health care provider says this is safe. °· Watch for any blood clots that may pass from your vagina. These may look like clumps of dark red, brown, or black discharge. °General instructions °· Keep your perineum clean and dry as told by your health care provider. °· Wear loose, comfortable clothing. °· Wipe from front to back when you use the toilet. °· Ask your health care provider if you can shower or take a bath. If you had an episiotomy or a perineal tear during labor and delivery, your health care provider may tell you not to take baths for a certain length of time. °· Wear a bra that supports your breasts and fits you well. °· If possible, have someone help you with household activities and help care for your baby for at least a few days after you   leave the hospital. °· Keep all follow-up visits for you and your baby as told by your health care provider. This is important. °Contact a health care provider if: °· You have: °? Vaginal discharge that has a bad smell. °? Difficulty urinating. °? Pain when urinating. °? A sudden increase or decrease in the frequency of your bowel movements. °? More redness, swelling, or pain around your episiotomy or vaginal tear. °? More fluid or blood coming from your episiotomy or vaginal  tear. °? Pus or a bad smell coming from your episiotomy or vaginal tear. °? A fever. °? A rash. °? Little or no interest in activities you used to enjoy. °? Questions about caring for yourself or your baby. °· Your episiotomy or vaginal tear feels warm to the touch. °· Your episiotomy or vaginal tear is separating or does not appear to be healing. °· Your breasts are painful, hard, or turn red. °· You feel unusually sad or worried. °· You feel nauseous or you vomit. °· You pass large blood clots from your vagina. If you pass a blood clot from your vagina, save it to show to your health care provider. Do not flush blood clots down the toilet without having your health care provider look at them. °· You urinate more than usual. °· You are dizzy or light-headed. °· You have not breastfed at all and you have not had a menstrual period for 12 weeks after delivery. °· You have stopped breastfeeding and you have not had a menstrual period for 12 weeks after you stopped breastfeeding. °Get help right away if: °· You have: °? Pain that does not go away or does not get better with medicine. °? Chest pain. °? Difficulty breathing. °? Blurred vision or spots in your vision. °? Thoughts about hurting yourself or your baby. °· You develop pain in your abdomen or in one of your legs. °· You develop a severe headache. °· You faint. °· You bleed from your vagina so much that you fill two sanitary pads in one hour. °This information is not intended to replace advice given to you by your health care provider. Make sure you discuss any questions you have with your health care provider. °Document Released: 01/17/2000 Document Revised: 07/03/2015 Document Reviewed: 02/03/2015 °Elsevier Interactive Patient Education © 2019 [employer] ° °

## 2018-01-22 NOTE — Discharge Summary (Signed)
OB Discharge Summary     Patient Name: Barbara Kennedy DOB: March 18, 2000 MRN: [id number]  Date of admission: 01/21/2018 Delivering MD: Justine NullWILSON, BRIDGID H   Date of discharge: 01/22/2018  Admitting diagnosis: 38WKS CTX Intrauterine pregnancy: 3159w0d     Secondary diagnosis:  Principal Problem:   Normal labor Active Problems:   MDD (major depressive disorder), single episode, severe , no psychosis (HCC)  Additional problems: post-placental placement of Liletta IUD     Discharge diagnosis: Term Pregnancy Delivered                                                                                                Post partum procedures:none  Augmentation: AROM  Complications: None  Hospital course:  Onset of Labor With Vaginal Delivery     17 y.o. yo G2P2002 at 3059w0d was admitted in Active Labor on 01/21/2018. Patient had an uncomplicated labor course as follows:  Membrane Rupture Time/Date: 6:41 AM ,01/21/2018   Intrapartum Procedures: Episiotomy: None [1]                                         Lacerations:  None [1]  Patient had a delivery of a Viable infant. 01/21/2018  Information for the patient's newborn:  Alleen BorneLUCAS Kennedy, Boy Jeannemarie [[id number]][[id number]]  Delivery Method: Vag-Spont    Pateint had an uncomplicated postpartum course.  She is ambulating, tolerating a regular diet, passing flatus, and urinating well. Patient is discharged home in stable condition on 01/22/18 per her request for early d/c if infant can go as well.   Physical exam  Vitals:   01/21/18 1400 01/21/18 1910 01/21/18 2248 01/22/18 0541  BP: 100/61 115/60 106/69 107/70  Pulse: 68 66 81 61  Resp: 20 18 16 16   Temp: 98.3 F (36.8 C) 99.4 F (37.4 C) 98.7 F (37.1 C) 98.4 F (36.9 C)  TempSrc:  Oral Oral Oral  SpO2: 100%     Weight:      Height:       General: alert and cooperative Lochia: appropriate Uterine Fundus: firm Incision: N/A DVT Evaluation: No evidence of DVT seen on physical  exam. Labs: Lab Results  Component Value Date   WBC 7.9 01/21/2018   HGB 10.5 (L) 01/21/2018   HCT 34.3 (L) 01/21/2018   MCV 80.1 01/21/2018   PLT 270 01/21/2018   CMP Latest Ref Rng & Units 03/31/2016  Glucose 65 - 99 mg/dL 782(N101(H)  BUN 6 - 20 mg/dL 5(L)  Creatinine 5.620.50 - 1.00 mg/dL 1.30(Q0.40(L)  Sodium 657135 - 846145 mmol/L 135  Potassium 3.5 - 5.1 mmol/L 4.2  Chloride 101 - 111 mmol/L 105  CO2 22 - 32 mmol/L 22  Calcium 8.9 - 10.3 mg/dL 9.0  Total Protein 6.5 - 8.1 g/dL 6.9  Total Bilirubin 0.3 - 1.2 mg/dL 0.6  Alkaline Phos 47 - 119 U/L 39(L)  AST 15 - 41 U/L 24  ALT 14 - 54 U/L 26    Discharge instruction: per After Visit Summary and "  Baby and Me Booklet".  After visit meds:  Allergies as of 01/22/2018      Reactions   Citric Acid Rash   Around mouth, chin area      Medication List    TAKE these medications   ibuprofen 600 MG tablet Commonly known as:  ADVIL,MOTRIN Take 1 tablet (600 mg total) by mouth every 6 (six) hours as needed.   PRENATAL GUMMIES/DHA & FA PO Take 2 capsules by mouth daily.       Diet: routine diet  Activity: Advance as tolerated. Pelvic rest for 6 weeks.   Outpatient follow up:4 weeks Follow up Appt: Future Appointments  Date Time Provider Department Center  01/31/2018  2:30 PM Leftwich-Kirby, Wilmer FloorLisa A, CNM [facility] None   Follow up Visit:No follow-ups on file.  Postpartum contraception: IUD Liletta- placed post-placental  Newborn Data: Live born female  Birth Weight: 6 lb 8.6 oz (2965 g) APGAR: 7, 9  Newborn Delivery   Birth date/time:  01/21/2018 07:05:00 Delivery type:  Vaginal, Spontaneous     Baby Feeding: Breast Disposition:home with mother   01/22/2018 Arabella MerlesKimberly D Shaw, CNM  9:37 AM

## 2018-01-22 NOTE — Progress Notes (Signed)
CSW acknowledges consult and completed clinical assessment.  Clinical documentation will follow.  There are no barriers to d/c.  Rudi Bunyard Boyd-Gilyard, MSW, LCSW Clinical Social Work [phone]   

## 2018-01-23 NOTE — Progress Notes (Signed)
CSW received consult for MH hx.CSW met with MOB to offer support and complete assessment.    When CSW arrived, MOB was watching TV and infant was asleep in bassinet. MOB was polite and receptive to meeting with CSW.   CSW asked about MOB's MH hx and MOB acknowledged a dx of depression and reported she was dx around 17 years of age. MOB reported not currently taking any meds and communication that her symptoms are managed my natural interventions (listening to music and talking with people she trust).   CSW provided education regarding the baby blues period vs. perinatal mood disorders, discussed treatment and gave resources for mental health follow up if concerns arise.  CSW recommends self-evaluation during the postpartum time period using the New Mom Checklist from Postpartum Progress and encouraged MOB to contact a medical professional if symptoms are noted at any time.  MOB presented with insight and awareness and did not demonstrate any acute MH symptoms.  CSW assessed for SI, HI, and DV and MOB denied them all. MOB reported feeling comfortable seeking help is help is needed.   CSW provided review of Sudden Infant Death Syndrome (SIDS) precautions.    CSW identifies no further need for intervention and no barriers to discharge at this time.  Laurey Arrow, MSW, LCSW Clinical Social Work [phone]

## 2018-01-31 ENCOUNTER — Encounter: Payer: Medicaid Other | Admitting: Advanced Practice Midwife

## 2018-02-21 ENCOUNTER — Ambulatory Visit: Payer: Medicaid Other | Admitting: Advanced Practice Midwife

## 2018-02-24 ENCOUNTER — Encounter: Payer: Self-pay | Admitting: Advanced Practice Midwife

## 2018-03-08 ENCOUNTER — Ambulatory Visit: Payer: Medicaid Other | Admitting: Obstetrics & Gynecology

## 2018-03-11 ENCOUNTER — Encounter (HOSPITAL_COMMUNITY): Payer: Self-pay | Admitting: Emergency Medicine

## 2018-03-11 ENCOUNTER — Other Ambulatory Visit: Payer: Self-pay

## 2018-03-11 ENCOUNTER — Emergency Department (HOSPITAL_COMMUNITY)
Admission: EM | Admit: 2018-03-11 | Discharge: 2018-03-12 | Disposition: A | Discharge status: Home / Self Care | Payer: Medicaid Other | Attending: Emergency Medicine | Admitting: Emergency Medicine

## 2018-03-11 DIAGNOSIS — B9789 Other viral agents as the cause of diseases classified elsewhere: Secondary | ICD-10-CM

## 2018-03-11 DIAGNOSIS — J028 Acute pharyngitis due to other specified organisms: Secondary | ICD-10-CM

## 2018-03-11 DIAGNOSIS — R51 Headache: Secondary | ICD-10-CM | POA: Insufficient documentation

## 2018-03-11 DIAGNOSIS — R519 Headache, unspecified: Secondary | ICD-10-CM

## 2018-03-11 DIAGNOSIS — J029 Acute pharyngitis, unspecified: Secondary | ICD-10-CM | POA: Diagnosis not present

## 2018-03-11 MED ORDER — ONDANSETRON 4 MG PO TBDP
4.0000 mg | ORAL_TABLET | Freq: Once | ORAL | Status: AC
  Administered 2018-03-12: 4 mg via ORAL
  Filled 2018-03-11: qty 1

## 2018-03-11 NOTE — ED Triage Notes (Signed)
C/o generalized body aches, sore throat, chills, and nausea x 2-3 days.  Denies cough.

## 2018-03-12 LAB — URINALYSIS, ROUTINE W REFLEX MICROSCOPIC
Bilirubin Urine: NEGATIVE
Glucose, UA: NEGATIVE mg/dL
Ketones, ur: 5 mg/dL — AB
Leukocytes, UA: NEGATIVE
Nitrite: NEGATIVE
Protein, ur: NEGATIVE mg/dL
Specific Gravity, Urine: 1.028 (ref 1.005–1.030)
pH: 7 (ref 5.0–8.0)

## 2018-03-12 LAB — CBC WITH DIFFERENTIAL/PLATELET
Abs Immature Granulocytes: 0.01 10*3/uL (ref 0.00–0.07)
Basophils Absolute: 0.1 10*3/uL (ref 0.0–0.1)
Basophils Relative: 1 %
Eosinophils Absolute: 0.1 10*3/uL (ref 0.0–0.5)
Eosinophils Relative: 1 %
HCT: 39.2 % (ref 36.0–46.0)
Hemoglobin: 11.6 g/dL — ABNORMAL LOW (ref 12.0–15.0)
IMMATURE GRANULOCYTES: 0 %
Lymphocytes Relative: 28 %
Lymphs Abs: 1.8 10*3/uL (ref 0.7–4.0)
MCH: 24.1 pg — ABNORMAL LOW (ref 26.0–34.0)
MCHC: 29.6 g/dL — ABNORMAL LOW (ref 30.0–36.0)
MCV: 81.3 fL (ref 80.0–100.0)
Monocytes Absolute: 0.6 10*3/uL (ref 0.1–1.0)
Monocytes Relative: 9 %
Neutro Abs: 3.9 10*3/uL (ref 1.7–7.7)
Neutrophils Relative %: 61 %
Platelets: 334 10*3/uL (ref 150–400)
RBC: 4.82 MIL/uL (ref 3.87–5.11)
RDW: 17.2 % — ABNORMAL HIGH (ref 11.5–15.5)
WBC: 6.4 10*3/uL (ref 4.0–10.5)
nRBC: 0 % (ref 0.0–0.2)

## 2018-03-12 LAB — COMPREHENSIVE METABOLIC PANEL
ALT: 28 U/L (ref 0–44)
AST: 27 U/L (ref 15–41)
Albumin: 4 g/dL (ref 3.5–5.0)
Alkaline Phosphatase: 62 U/L (ref 38–126)
Anion gap: 11 (ref 5–15)
BUN: 11 mg/dL (ref 6–20)
CHLORIDE: 110 mmol/L (ref 98–111)
CO2: 21 mmol/L — ABNORMAL LOW (ref 22–32)
CREATININE: 0.81 mg/dL (ref 0.44–1.00)
Calcium: 8.9 mg/dL (ref 8.9–10.3)
GFR calc Af Amer: >60 mL/min (ref >60)
GFR calc non Af Amer: >60 mL/min (ref >60)
Glucose, Bld: 107 mg/dL — ABNORMAL HIGH (ref 70–99)
Potassium: 3.7 mmol/L (ref 3.5–5.1)
Sodium: 142 mmol/L (ref 135–145)
Total Bilirubin: 0.7 mg/dL (ref 0.3–1.2)
Total Protein: 6.7 g/dL (ref 6.5–8.1)

## 2018-03-12 LAB — I-STAT BETA HCG BLOOD, ED (MC, WL, AP ONLY): I-stat hCG, quantitative: <5 m[IU]/mL (ref <5)

## 2018-03-12 NOTE — ED Provider Notes (Signed)
[facility]   CSN: [id number] Arrival date & time: 03/11/18  2307     History   Chief Complaint Chief Complaint  Patient presents with  . Headache  . Sore Throat    HPI Barbara Kennedy is a 18 y.o. female with a hx of vaginal delivery Dec 20th presents to the Emergency Department complaining of gradual, persistent, progressively worsening headaches onset 5 weeks ago.  Pt reports pain is generalized. Pt reports pain is a 9/10.  Pt reports headaches occur 1-2x per week.  Pt reports headache looks almost the entire day and then resolves completely.  Pt reports she does not take medication for this.  She denies recent falls or injuries.  Pt reports she did not previously get headaches on a regular basis. Pt reports no associated vision changes, nausea, vomiting, diaphoresis, neck pain, fever, chills.  Pt denies aggravating or alleviating factors.  Pt denies current headache.  Pt reports he last headache was sometime this week, but she cannot remember when.  Pt reports she had an IUD placed after giving birth.    Additionally, she c/o nausea and sore throat onset 2 weeks ago.  Pt denies known sick contacts.  Pt reports she did get a flu shot this year. Pt denies nasal congestion, rhinorrhea, postnasal drip, fever, chills, neck pain, neck stiffness, chest pain, shortness of breath, cough, abdominal pain, vomiting, diarrhea, weakness, dizziness, syncope, dysuria, hematuria.  Patient reports she has been able to eat and drink without difficulty.  The history is provided by the patient and medical records. No language interpreter was used.    Past Medical History:  Diagnosis Date  . Tonsillitis     Patient Active Problem List   Diagnosis Date Noted  . Normal labor 01/21/2018  . Encounter for supervision of normal pregnancy, unspecified, unspecified trimester 09/15/2017  . MDD (major depressive disorder), single episode, severe , no  psychosis (HCC) 04/05/2015    Past Surgical History:  Procedure Laterality Date  . TONSILLECTOMY       OB History    Gravida  2   Para  2   Term  2   Preterm      AB      Living  2     SAB      TAB      Ectopic      Multiple  0   Live Births  2            Home Medications    Prior to Admission medications   Medication Sig Start Date End Date Taking? Authorizing Provider  ibuprofen (ADVIL,MOTRIN) 600 MG tablet Take 1 tablet (600 mg total) by mouth every 6 (six) hours as needed. Patient not taking: Reported on 03/12/2018 01/22/18   Arabella MerlesShaw, Kimberly D, CNM    Family History Family History  Problem Relation Age of Onset  . Diabetes Maternal Grandmother     Social History Social History   Tobacco Use  . Smoking status: Never Smoker  . Smokeless tobacco: Never Used  Substance Use Topics  . Alcohol use: No  . Drug use: No     Allergies   Citric acid   Review of Systems Review of Systems  Constitutional: Negative for appetite change, diaphoresis, fatigue, fever and unexpected weight change.  HENT: Positive for sore throat. Negative for mouth sores.   Eyes: Negative for visual disturbance.  Respiratory: Negative for cough, chest tightness, shortness of breath and wheezing.  Cardiovascular: Negative for chest pain.  Gastrointestinal: Positive for nausea. Negative for abdominal pain, constipation, diarrhea and vomiting.  Endocrine: Negative for polydipsia, polyphagia and polyuria.  Genitourinary: Negative for dysuria, frequency, hematuria and urgency.  Musculoskeletal: Negative for back pain and neck stiffness.  Skin: Negative for rash.  Allergic/Immunologic: Negative for immunocompromised state.  Neurological: Positive for headaches. Negative for syncope and light-headedness.  Hematological: Does not bruise/bleed easily.  Psychiatric/Behavioral: Negative for sleep disturbance. The patient is not nervous/anxious.      Physical Exam Updated  Vital Signs BP (!) 142/72 (BP Location: Right Arm)   Pulse 78   Temp 98.3 F (36.8 C) (Oral)   Resp 16   SpO2 99%   Physical Exam Vitals signs and nursing note reviewed.  Constitutional:      General: She is not in acute distress.    Appearance: She is well-developed. She is not diaphoretic.  HENT:     Head: Normocephalic and atraumatic.     Right Ear: Tympanic membrane, ear canal and external ear normal.     Left Ear: Tympanic membrane, ear canal and external ear normal.     Nose: Mucosal edema and rhinorrhea present.     Right Sinus: No maxillary sinus tenderness or frontal sinus tenderness.     Left Sinus: No maxillary sinus tenderness or frontal sinus tenderness.     Mouth/Throat:     Mouth: Mucous membranes are not pale and not cyanotic.     Pharynx: Uvula midline. No oropharyngeal exudate or posterior oropharyngeal erythema.     Tonsils: No tonsillar abscesses.  Eyes:     General: No scleral icterus.    Conjunctiva/sclera: Conjunctivae normal.     Pupils: Pupils are equal, round, and reactive to light.     Comments: No horizontal, vertical or rotational nystagmus  Neck:     Musculoskeletal: Full passive range of motion without pain, normal range of motion and neck supple.     Comments: Full active and passive ROM without pain No midline or paraspinal tenderness No nuchal rigidity or meningeal signs Cardiovascular:     Rate and Rhythm: Normal rate and regular rhythm.  Pulmonary:     Effort: Pulmonary effort is normal. No respiratory distress.     Breath sounds: Normal breath sounds. No stridor. No wheezing or rales.  Abdominal:     General: Bowel sounds are normal.     Palpations: Abdomen is soft.     Tenderness: There is no abdominal tenderness. There is no guarding or rebound.  Musculoskeletal: Normal range of motion.  Lymphadenopathy:     Cervical: No cervical adenopathy.  Skin:    General: Skin is warm and dry.     Findings: No rash.  Neurological:      Mental Status: She is alert and oriented to person, place, and time.     Cranial Nerves: No cranial nerve deficit.     Motor: No abnormal muscle tone.     Coordination: Coordination normal.     Comments: Mental Status:  Alert, oriented, thought content appropriate. Speech fluent without evidence of aphasia. Able to follow 2 step commands without difficulty.  Cranial Nerves:  II:  Peripheral visual fields grossly normal, pupils equal, round, reactive to light III,IV, VI: ptosis not present, extra-ocular motions intact bilaterally  V,VII: smile symmetric, facial light touch sensation equal VIII: hearing grossly normal bilaterally  IX,X: midline uvula rise  XI: bilateral shoulder shrug equal and strong XII: midline tongue extension  Motor:  5/5 in  upper and lower extremities bilaterally including strong and equal grip strength and dorsiflexion/plantar flexion Sensory: Pinprick and light touch normal in all extremities.  Cerebellar: normal finger-to-nose with bilateral upper extremities Gait: normal gait and balance CV: distal pulses palpable throughout   Psychiatric:        Behavior: Behavior normal.        Thought Content: Thought content normal.        Judgment: Judgment normal.      ED Treatments / Results  Labs (all labs ordered are listed, but only abnormal results are displayed) Labs Reviewed  CBC WITH DIFFERENTIAL/PLATELET - Abnormal; Notable for the following components:      Result Value   Hemoglobin 11.6 (*)    MCH 24.1 (*)    MCHC 29.6 (*)    RDW 17.2 (*)    All other components within normal limits  URINALYSIS, ROUTINE W REFLEX MICROSCOPIC - Abnormal; Notable for the following components:   APPearance HAZY (*)    Hgb urine dipstick SMALL (*)    Ketones, ur 5 (*)    Bacteria, UA RARE (*)    All other components within normal limits  COMPREHENSIVE METABOLIC PANEL - Abnormal; Notable for the following components:   CO2 21 (*)    Glucose, Bld 107 (*)    All other  components within normal limits  I-STAT BETA HCG BLOOD, ED ([facility], [facility], [facility] ONLY)    Procedures Procedures (including critical care time)  Medications Ordered in ED Medications  ondansetron (ZOFRAN-ODT) disintegrating tablet 4 mg (4 mg Oral Given 03/12/18 0141)     Initial Impression / Assessment and Plan / ED Course  I have reviewed the triage vital signs and the nursing notes.  Pertinent labs & imaging results that were available during my care of the patient were reviewed by me and considered in my medical decision making (see chart for details).     Presents with multiple complaints.  She reports intermittent headaches since delivery of her son.  They resolve without intervention and are without nausea, vomiting, vision changes.  Patient with normal neurologic exam.  She has no history of DVT or clotting disorders.  Less likely to be dural venous thrombosis or intracranial mass.  Patient did have IUD placed after birth and I suspect headaches are likely hormonally mediated.  Suggest patient keep a headache diary, Tylenol for headaches and follow closely with her primary care or OB/GYN about this.  Discussed reasons to return immediately to the emergency department including red flags for headaches.  Patient also with concerns that she is "getting sick."  Her clinical exam is reassuring.  No evidence of otitis media, strep pharyngitis.  Lungs are clear and equal.  Highly doubt pneumonia.  Patient is without hypoxia or cough.  No influenza contacts.  Patient is afebrile, less likely to be influenza.  Suspect this is virally mediated.  Discussed conservative therapies at home and reasons to return to the emergency department.  Patient states understanding and is in agreement with the plan.  Final Clinical Impressions(s) / ED Diagnoses   Final diagnoses:  Intermittent headache  Sore throat (viral)    ED Discharge Orders    None       Mardene SayerMuthersbaugh, Boyd KerbsHannah, PA-C 03/12/18 [id number]      Shaune PollackIsaacs, Cameron, MD 03/12/18 [phone]

## 2018-03-12 NOTE — Discharge Instructions (Addendum)
1. Medications: Tylenol for headache, over-the-counter medications for other symptoms, usual home medications 2. Treatment: rest, drink plenty of fluids, take tylenol or ibuprofen for fever control 3. Follow Up: Please followup with your primary doctor in 3 days for discussion of your diagnoses and further evaluation after today's visit; if you do not have a primary care doctor use the resource guide provided to find one; Return to the ER for high fevers, difficulty breathing, vomiting, headaches with vision changes or other concerning symptoms

## 2018-03-12 NOTE — ED Notes (Signed)
Urine Culture sent to lab if needed.  

## 2018-03-12 NOTE — ED Notes (Signed)
D/c instructions, follow up and return precautions d/w pt.  Pt verbalized understanding.  Unable to obtain e-sign d/t equipment malfunction. 

## 2018-03-15 ENCOUNTER — Telehealth: Payer: Self-pay | Admitting: Licensed Clinical Social Worker

## 2018-03-15 NOTE — Telephone Encounter (Signed)
Spoke to pt to remind about appt 2/12.

## 2018-03-16 ENCOUNTER — Ambulatory Visit: Payer: Medicaid Other | Admitting: Nurse Practitioner

## 2018-03-16 NOTE — Progress Notes (Deleted)
Disregard

## 2018-03-17 ENCOUNTER — Encounter: Payer: Self-pay | Admitting: Nurse Practitioner

## 2018-08-01 ENCOUNTER — Other Ambulatory Visit: Payer: Self-pay

## 2018-08-01 ENCOUNTER — Ambulatory Visit (INDEPENDENT_AMBULATORY_CARE_PROVIDER_SITE_OTHER): Payer: Medicaid Other

## 2018-08-01 DIAGNOSIS — Z3201 Encounter for pregnancy test, result positive: Secondary | ICD-10-CM | POA: Diagnosis not present

## 2018-08-01 DIAGNOSIS — T8331XA Breakdown (mechanical) of intrauterine contraceptive device, initial encounter: Secondary | ICD-10-CM

## 2018-08-01 LAB — POCT URINE PREGNANCY: Preg Test, Ur: POSITIVE — AB

## 2018-08-01 NOTE — Progress Notes (Signed)
Ms. JALEEN GRUPP presents today for UPT. IUD was inserted postplacental 01/21/2018. Unsure if IUD expelled per pt.   LMP: 06/17/2018    OBJECTIVE: Appears well, in no apparent distress.  OB History    Gravida  3   Para  2   Term  2   Preterm      AB      Living  2     SAB      TAB      Ectopic      Multiple  0   Live Births  2          Home UPT Result: Positive In-Office UPT result: Positive  I have reviewed the patient's medical, obstetrical, social, and family histories, and medications.   ASSESSMENT: Positive pregnancy test  PLAN: Pt needs ultrasound STAT per Dr. Lars Mage sent today Prenatal care to be completed at:  [facility]

## 2018-08-02 ENCOUNTER — Other Ambulatory Visit: Payer: Self-pay | Admitting: Obstetrics & Gynecology

## 2018-08-02 ENCOUNTER — Ambulatory Visit (HOSPITAL_COMMUNITY)
Admission: RE | Admit: 2018-08-02 | Discharge: 2018-08-02 | Disposition: A | Discharge status: Home / Self Care | Payer: Medicaid Other | Source: Ambulatory Visit | Attending: Obstetrics & Gynecology | Admitting: Obstetrics & Gynecology

## 2018-08-02 DIAGNOSIS — T8331XA Breakdown (mechanical) of intrauterine contraceptive device, initial encounter: Secondary | ICD-10-CM | POA: Insufficient documentation

## 2018-08-02 DIAGNOSIS — O9989 Other specified diseases and conditions complicating pregnancy, childbirth and the puerperium: Secondary | ICD-10-CM | POA: Diagnosis present

## 2018-08-02 DIAGNOSIS — Z331 Pregnant state, incidental: Secondary | ICD-10-CM

## 2018-08-02 LAB — BETA HCG QUANT (REF LAB): hCG Quant: 30465 m[IU]/mL

## 2018-09-01 ENCOUNTER — Other Ambulatory Visit: Payer: Self-pay

## 2018-09-01 ENCOUNTER — Encounter: Payer: Self-pay | Admitting: Certified Nurse Midwife

## 2018-09-01 ENCOUNTER — Ambulatory Visit (INDEPENDENT_AMBULATORY_CARE_PROVIDER_SITE_OTHER): Payer: Medicaid Other | Admitting: Certified Nurse Midwife

## 2018-09-01 ENCOUNTER — Other Ambulatory Visit (HOSPITAL_COMMUNITY)
Admission: RE | Admit: 2018-09-01 | Discharge: 2018-09-01 | Disposition: A | Discharge status: Home / Self Care | Payer: Medicaid Other | Source: Ambulatory Visit | Attending: Certified Nurse Midwife | Admitting: Certified Nurse Midwife

## 2018-09-01 VITALS — BP 125/76 | HR 94 | Wt 150.0 lb

## 2018-09-01 DIAGNOSIS — Z3480 Encounter for supervision of other normal pregnancy, unspecified trimester: Secondary | ICD-10-CM | POA: Diagnosis present

## 2018-09-01 DIAGNOSIS — Z3A10 10 weeks gestation of pregnancy: Secondary | ICD-10-CM | POA: Diagnosis not present

## 2018-09-01 DIAGNOSIS — O219 Vomiting of pregnancy, unspecified: Secondary | ICD-10-CM

## 2018-09-01 DIAGNOSIS — Z3481 Encounter for supervision of other normal pregnancy, first trimester: Secondary | ICD-10-CM

## 2018-09-01 MED ORDER — BLOOD PRESSURE CUFF MISC: 1.0000 | Freq: Once | 0 refills | Status: AC

## 2018-09-01 MED ORDER — ONDANSETRON 4 MG PO TBDP: 4.0000 mg | ORAL_TABLET | Freq: Three times a day (TID) | ORAL | 2 refills | Status: DC | PRN

## 2018-09-01 NOTE — Patient Instructions (Signed)

## 2018-09-01 NOTE — Progress Notes (Signed)
Pt is here for initial OB appointment. LMP 06/17/2018.

## 2018-09-01 NOTE — Progress Notes (Signed)
History:   Barbara Kennedy is a 18 y.o. G3P2002 at [redacted]w[redacted]d by LMP being seen today for her first obstetrical visit.  Her obstetrical history is significant for closed spaced pregnancies. Patient does intend to breast feed. Pregnancy history fully reviewed.  Patient reports nausea and vomiting.      HISTORY: OB History  Gravida Para Term Preterm AB Living  3 2 2  0 0 2  SAB TAB Ectopic Multiple Live Births  0 0 0 0 2    # Outcome Date GA Lbr Len/2nd Weight Sex Delivery Anes PTL Lv  3 Current           2 Term 01/21/18 [redacted]w[redacted]d 05:36 / 00:29 6 lb 8.6 oz (2.965 kg) M Vag-Spont EPI  LIV     Name: LUCAS FRITTS,BOY Bonnell     Apgar1: 7  Apgar5: 9  1 Term 10/27/16 [redacted]w[redacted]d  7 lb 9 oz (3.43 kg) F Vag-Spont   LIV    Patient is <21yo, no pap needed   Past Medical History:  Diagnosis Date  . Encounter for supervision of normal pregnancy, unspecified, unspecified trimester 09/15/2017      Nursing Staff Provider Office Location  [facility] Dating  19 wk [place] Language  English Anatomy US  WNL Flu Vaccine  09/15/17 Genetic Screen  declined TDaP vaccine   11/10/17 Hgb A1C or  GTT Third trimester nl 2 hour Rhogam  n/a   LAB RESULTS  Feeding Plan Breast Blood Type A/Positive/-- (08/14 1538)  Contraception PP IUD  Antibody Negative (08/14 1538) Circumcision Yes if a boy Rubella 3.51 (08/14  . Tonsillitis    Past Surgical History:  Procedure Laterality Date  . TONSILLECTOMY     Family History  Problem Relation Age of Onset  . Diabetes Maternal Grandmother    Social History   Tobacco Use  . Smoking status: Never Smoker  . Smokeless tobacco: Never Used  Substance Use Topics  . Alcohol use: No  . Drug use: No   Allergies  Allergen Reactions  . Citric Acid Rash    Around mouth, chin area   Current Outpatient Medications on File Prior to Visit  Medication Sig Dispense Refill  . ibuprofen (ADVIL,MOTRIN) 600 MG tablet Take 1 tablet (600 mg total) by mouth every 6 (six) hours as needed. (Patient not  taking: Reported on 03/12/2018) 30 tablet 0   No current facility-administered medications on file prior to visit.     Review of Systems Pertinent items noted in HPI and remainder of comprehensive ROS otherwise negative. Physical Exam:   Vitals:   09/01/18 1401  BP: 125/76  Pulse: 94  Weight: 150 lb (68 kg)   Fetal Heart Rate (bpm): 170 System: General: well-developed, well-nourished female in no acute distress   Skin: normal coloration and turgor, no rashes   Neurologic: oriented, normal, negative, normal mood   Extremities: normal strength, tone, and muscle mass, ROM of all joints is normal   HEENT PERRLA, extraocular movement intact and sclera clear, anicteric   Mouth/Teeth mucous membranes moist, pharynx normal without lesions and dental hygiene good   Neck supple and no masses   Cardiovascular: regular rate and rhythm   Respiratory:  no respiratory distress, normal breath sounds   Abdomen: soft, non-tender; bowel sounds normal; no masses,  no organomegaly     Assessment:    Pregnancy: [id number] Patient Active Problem List   Diagnosis Date Noted  . Supervision of other normal pregnancy, antepartum 09/01/2018  . Nausea and vomiting  during pregnancy 09/01/2018  . Normal labor 01/21/2018  . MDD (major depressive disorder), single episode, severe , no psychosis (HCC) 04/05/2015     Plan:    1. Supervision of other normal pregnancy, antepartum - Recent delivery in December 2019, no SMA or hgb Electro labs needed - Patient received a postplacental IUD at time of delivery, denies IUD falling out, IUD not seen on US performed last month  - Patient desires another IUD but does not want in postplacental - Babyscripts reactivated  - Patient believes she still has BP cuff at home, will call office if she has it or not, if she does not then we will send in Rx for blood pressure cuff  - COVID 19 precautions discussed  - Obstetric Panel, Including HIV - Culture, OB Urine - Genetic  Screening - Urine cytology ancillary only([place]) - Blood Pressure Monitoring (BLOOD PRESSURE CUFF) MISC; 1 Device by Does not apply route once for 1 dose.  Dispense: 1 each; Refill: 0 - Babyscripts Schedule Optimization - US MFM OB COMP + 14 WK; Future  2. Nausea and vomiting during pregnancy - Patient reports occasional N/V, request medication - ondansetron (ZOFRAN ODT) 4 MG disintegrating tablet; Take 1 tablet (4 mg total) by mouth every 8 (eight) hours as needed for nausea or vomiting.  Dispense: 30 tablet; Refill: 2   Initial labs drawn. Continue prenatal vitamins. Genetic Screening discussed, NIPS: ordered. Ultrasound discussed; fetal anatomic survey: ordered. Problem list reviewed and updated. The nature [facility] [place] [facility] with multiple MDs and other Advanced Practice Providers was explained to patient; also emphasized that residents, students are part of our team. Routine obstetric precautions reviewed. Return in about 4 weeks (around 09/29/2018) for ROB-mychart.     Sharyon CableVeronica C Makeisha Jentsch, CNM [facility], [facility]

## 2018-09-03 LAB — OBSTETRIC PANEL, INCLUDING HIV
Antibody Screen: NEGATIVE
Basophils Absolute: 0 10*3/uL (ref 0.0–0.2)
Basos: 0 %
EOS (ABSOLUTE): 0.2 10*3/uL (ref 0.0–0.4)
Eos: 2 %
HIV Screen 4th Generation wRfx: NONREACTIVE
Hematocrit: 35.3 % (ref 34.0–46.6)
Hemoglobin: 11.8 g/dL (ref 11.1–15.9)
Hepatitis B Surface Ag: NEGATIVE
Immature Grans (Abs): 0 10*3/uL (ref 0.0–0.1)
Immature Granulocytes: 0 %
Lymphocytes Absolute: 1.5 10*3/uL (ref 0.7–3.1)
Lymphs: 21 %
MCH: 25.7 pg — ABNORMAL LOW (ref 26.6–33.0)
MCHC: 33.4 g/dL (ref 31.5–35.7)
MCV: 77 fL — ABNORMAL LOW (ref 79–97)
Monocytes Absolute: 0.6 10*3/uL (ref 0.1–0.9)
Monocytes: 9 %
Neutrophils Absolute: 4.6 10*3/uL (ref 1.4–7.0)
Neutrophils: 68 %
Platelets: 379 10*3/uL (ref 150–450)
RBC: 4.59 x10E6/uL (ref 3.77–5.28)
RDW: 14.3 % (ref 11.7–15.4)
RPR Ser Ql: NONREACTIVE
Rh Factor: POSITIVE
Rubella Antibodies, IGG: 4.82 index (ref >0.99)
WBC: 7 10*3/uL (ref 3.4–10.8)

## 2018-09-03 LAB — URINE CULTURE, OB REFLEX: Organism ID, Bacteria: NO GROWTH

## 2018-09-03 LAB — URINE CYTOLOGY ANCILLARY ONLY
Chlamydia: NEGATIVE
Neisseria Gonorrhea: NEGATIVE

## 2018-09-03 LAB — CULTURE, OB URINE

## 2018-09-07 ENCOUNTER — Encounter: Payer: Self-pay | Admitting: Certified Nurse Midwife

## 2018-09-12 ENCOUNTER — Encounter: Payer: Self-pay | Admitting: Certified Nurse Midwife

## 2018-09-29 ENCOUNTER — Telehealth: Payer: Medicaid Other | Admitting: Certified Nurse Midwife

## 2018-09-29 DIAGNOSIS — Z3480 Encounter for supervision of other normal pregnancy, unspecified trimester: Secondary | ICD-10-CM

## 2018-09-29 NOTE — Progress Notes (Signed)
Patient did not answer or log onto mychart appointment- patient not seen.

## 2018-09-29 NOTE — Progress Notes (Signed)
Patient did not answer nurse intake call, left VM to call back.

## 2018-10-12 ENCOUNTER — Telehealth: Payer: Self-pay | Admitting: Certified Nurse Midwife

## 2018-10-12 NOTE — Telephone Encounter (Signed)
-----   Message from Lajean Manes, CNM sent at 09/29/2018  5:09 PM EDT ----- Patient MyChart visit needs to be rescheduled, patient did not log on for appointment and did not answer phone or call office back    [facility]

## 2018-10-28 ENCOUNTER — Ambulatory Visit (HOSPITAL_COMMUNITY): Payer: Medicaid Other

## 2019-02-01 ENCOUNTER — Emergency Department (HOSPITAL_COMMUNITY): Payer: Medicaid Other

## 2019-02-01 ENCOUNTER — Telehealth: Payer: Self-pay | Admitting: Certified Nurse Midwife

## 2019-02-01 ENCOUNTER — Emergency Department (HOSPITAL_COMMUNITY)
Admission: EM | Admit: 2019-02-01 | Discharge: 2019-02-01 | Disposition: A | Discharge status: Home / Self Care | Payer: Medicaid Other | Attending: Emergency Medicine | Admitting: Emergency Medicine

## 2019-02-01 ENCOUNTER — Other Ambulatory Visit: Payer: Self-pay

## 2019-02-01 ENCOUNTER — Encounter (HOSPITAL_COMMUNITY): Payer: Self-pay

## 2019-02-01 DIAGNOSIS — N939 Abnormal uterine and vaginal bleeding, unspecified: Secondary | ICD-10-CM

## 2019-02-01 DIAGNOSIS — O418X10 Other specified disorders of amniotic fluid and membranes, first trimester, not applicable or unspecified: Secondary | ICD-10-CM

## 2019-02-01 DIAGNOSIS — Z3A01 Less than 8 weeks gestation of pregnancy: Secondary | ICD-10-CM | POA: Diagnosis not present

## 2019-02-01 DIAGNOSIS — O468X1 Other antepartum hemorrhage, first trimester: Secondary | ICD-10-CM

## 2019-02-01 DIAGNOSIS — O208 Other hemorrhage in early pregnancy: Secondary | ICD-10-CM | POA: Diagnosis present

## 2019-02-01 DIAGNOSIS — Z3491 Encounter for supervision of normal pregnancy, unspecified, first trimester: Secondary | ICD-10-CM

## 2019-02-01 LAB — CBC WITH DIFFERENTIAL/PLATELET
Abs Immature Granulocytes: 0.04 10*3/uL (ref 0.00–0.07)
Basophils Absolute: 0.1 10*3/uL (ref 0.0–0.1)
Basophils Relative: 1 %
Eosinophils Absolute: 0.1 10*3/uL (ref 0.0–0.5)
Eosinophils Relative: 1 %
HCT: 39.1 % (ref 36.0–46.0)
Hemoglobin: 12 g/dL (ref 12.0–15.0)
Immature Granulocytes: 0 %
Lymphocytes Relative: 21 %
Lymphs Abs: 2 10*3/uL (ref 0.7–4.0)
MCH: 26 pg (ref 26.0–34.0)
MCHC: 30.7 g/dL (ref 30.0–36.0)
MCV: 84.6 fL (ref 80.0–100.0)
Monocytes Absolute: 0.9 10*3/uL (ref 0.1–1.0)
Monocytes Relative: 9 %
Neutro Abs: 6.6 10*3/uL (ref 1.7–7.7)
Neutrophils Relative %: 68 %
Platelets: 383 10*3/uL (ref 150–400)
RBC: 4.62 MIL/uL (ref 3.87–5.11)
RDW: 14.2 % (ref 11.5–15.5)
WBC: 9.7 10*3/uL (ref 4.0–10.5)
nRBC: 0 % (ref 0.0–0.2)

## 2019-02-01 LAB — URINALYSIS, ROUTINE W REFLEX MICROSCOPIC
Bacteria, UA: NONE SEEN
Bilirubin Urine: NEGATIVE
Glucose, UA: NEGATIVE mg/dL
Ketones, ur: NEGATIVE mg/dL
Nitrite: NEGATIVE
Protein, ur: 30 mg/dL — AB
Specific Gravity, Urine: 1.025 (ref 1.005–1.030)
pH: 7 (ref 5.0–8.0)

## 2019-02-01 LAB — COMPREHENSIVE METABOLIC PANEL
ALT: 20 U/L (ref 0–44)
AST: 17 U/L (ref 15–41)
Albumin: 4.2 g/dL (ref 3.5–5.0)
Alkaline Phosphatase: 42 U/L (ref 38–126)
Anion gap: 8 (ref 5–15)
BUN: 10 mg/dL (ref 6–20)
CO2: 23 mmol/L (ref 22–32)
Calcium: 8.9 mg/dL (ref 8.9–10.3)
Chloride: 106 mmol/L (ref 98–111)
Creatinine, Ser: 0.6 mg/dL (ref 0.44–1.00)
GFR calc Af Amer: >60 mL/min (ref >60)
GFR calc non Af Amer: >60 mL/min (ref >60)
Glucose, Bld: 95 mg/dL (ref 70–99)
Potassium: 3.6 mmol/L (ref 3.5–5.1)
Sodium: 137 mmol/L (ref 135–145)
Total Bilirubin: 0.5 mg/dL (ref 0.3–1.2)
Total Protein: 7 g/dL (ref 6.5–8.1)

## 2019-02-01 LAB — HCG, QUANTITATIVE, PREGNANCY: hCG, Beta Chain, Quant, S: 5644 m[IU]/mL — ABNORMAL HIGH (ref <5)

## 2019-02-01 LAB — POC URINE PREG, ED: Preg Test, Ur: POSITIVE — AB

## 2019-02-01 NOTE — ED Provider Notes (Signed)
[facility]-EMERGENCY DEPT Provider Note   CSN: [id number] Arrival date & time: 02/01/19  0108     History Chief Complaint  Patient presents with  . Vaginal Bleeding    Barbara Kennedy is a 18 y.o. female who presents to the emergency department with a chief complaint of vaginal bleeding.  The patient reports that her last menstrual cycle was on 11/17.  She reports that she recently took a home pregnancy test that was positive.  Yesterday, the patient developed vaginal bleeding that has been persistent, but significantly improving since onset.  She reports that she used a couple of pads yesterday due to the bleeding, but today she only noticed blood with wiping.  She reports she is also been having some lower abdominal cramping for about the last week.  No known aggravating or alleviating factors.  She is not currently on any form of birth control.   She denies fever, chills, nausea, vomiting, dysuria, hematuria, vaginal discharge, pain, diarrhea, constipation.  No treatment prior to arrival.  Ports that she had no complications with her 2 previous pregnancies.  Reports this is her third pregnancy.  She has no concerns for STIs.  The history is provided by the patient. No language interpreter was used.       Past Medical History:  Diagnosis Date  . Encounter for supervision of normal pregnancy, unspecified, unspecified trimester 09/15/2017      Nursing Staff Provider Office Location  [facility] Dating  19 wk [place] Language  English Anatomy US  WNL Flu Vaccine  09/15/17 Genetic Screen  declined TDaP vaccine   11/10/17 Hgb A1C or  GTT Third trimester nl 2 hour Rhogam  n/a   LAB RESULTS  Feeding Plan Breast Blood Type A/Positive/-- (08/14 1538)  Contraception PP IUD  Antibody Negative (08/14 1538) Circumcision Yes if a boy Rubella 3.51 (08/14  . Tonsillitis     Patient Active Problem List   Diagnosis Date Noted  . Supervision of other normal pregnancy, antepartum  09/01/2018  . Nausea and vomiting during pregnancy 09/01/2018  . MDD (major depressive disorder), single episode, severe , no psychosis (HCC) 04/05/2015    Past Surgical History:  Procedure Laterality Date  . TONSILLECTOMY       OB History    Gravida  3   Para  2   Term  2   Preterm      AB      Living  2     SAB      TAB      Ectopic      Multiple  0   Live Births  2           Family History  Problem Relation Age of Onset  . Diabetes Maternal Grandmother     Social History   Tobacco Use  . Smoking status: Never Smoker  . Smokeless tobacco: Never Used  Substance Use Topics  . Alcohol use: Yes  . Drug use: No    Home Medications Prior to Admission medications   Not on File    Allergies    Citric acid  Review of Systems   Review of Systems  Constitutional: Negative for activity change, chills and fever.  Respiratory: Negative for shortness of breath.   Cardiovascular: Negative for chest pain.  Gastrointestinal: Negative for abdominal pain, blood in stool, diarrhea, nausea and vomiting.  Genitourinary: Positive for pelvic pain and vaginal bleeding. Negative for dysuria, flank pain, genital sores, hematuria, menstrual problem,  vaginal discharge and vaginal pain.  Musculoskeletal: Negative for back pain, myalgias, neck pain and neck stiffness.  Skin: Negative for rash and wound.  Allergic/Immunologic: Negative for immunocompromised state.  Neurological: Negative for dizziness, seizures, syncope, weakness, numbness and headaches.  Psychiatric/Behavioral: Negative for confusion.    Physical Exam Updated Vital Signs BP 124/85 (BP Location: Left Arm)   Pulse 91   Temp 98.8 F (37.1 C) (Oral)   Resp 16   Ht 5\' 1"  (1.549 m)   Wt 63.5 kg   LMP 06/17/2018 (Exact Date)   SpO2 99%   BMI 26.45 kg/m   Physical Exam Vitals and nursing note reviewed.  Constitutional:      General: She is not in acute distress.    Comments: Well-appearing.  No  acute distress.  HENT:     Head: Normocephalic.  Eyes:     Conjunctiva/sclera: Conjunctivae normal.  Cardiovascular:     Rate and Rhythm: Normal rate and regular rhythm.     Heart sounds: No murmur. No friction rub. No gallop.   Pulmonary:     Effort: Pulmonary effort is normal. No respiratory distress.     Breath sounds: No stridor. No wheezing, rhonchi or rales.  Chest:     Chest wall: No tenderness.  Abdominal:     General: There is no distension.     Palpations: Abdomen is soft. There is no mass.     Tenderness: There is no abdominal tenderness. There is no right CVA tenderness, left CVA tenderness, guarding or rebound.     Hernia: No hernia is present.     Comments: Abdomen is soft, nontender, nondistended.  Musculoskeletal:     Cervical back: Neck supple.     Right lower leg: No edema.     Left lower leg: No edema.  Skin:    General: Skin is warm.     Findings: No rash.  Neurological:     Mental Status: She is alert.  Psychiatric:        Behavior: Behavior normal.     ED Results / Procedures / Treatments   Labs (all labs ordered are listed, but only abnormal results are displayed) Labs Reviewed  HCG, QUANTITATIVE, PREGNANCY - Abnormal; Notable for the following components:      Result Value   hCG, Beta Chain, Quant, S 5,644 (*)    All other components within normal limits  URINALYSIS, ROUTINE W REFLEX MICROSCOPIC - Abnormal; Notable for the following components:   APPearance HAZY (*)    Hgb urine dipstick SMALL (*)    Protein, ur 30 (*)    Leukocytes,Ua TRACE (*)    All other components within normal limits  POC URINE PREG, ED - Abnormal; Notable for the following components:   Preg Test, Ur POSITIVE (*)    All other components within normal limits  CBC WITH DIFFERENTIAL/PLATELET  COMPREHENSIVE METABOLIC PANEL  GC/CHLAMYDIA PROBE AMP ([place]) NOT AT [facility]    EKG None  Radiology [facility] OB Comp < 14 Wks  Result Date: 02/01/2019 CLINICAL DATA:  Initial  evaluation for vaginal bleeding, early pregnancy. EXAM: OBSTETRIC <14 WK 02/03/2019 AND TRANSVAGINAL OB US TECHNIQUE: Both transabdominal and transvaginal ultrasound examinations were performed for complete evaluation of the gestation as well as the maternal uterus, adnexal regions, and pelvic cul-de-sac. Transvaginal technique was performed to assess early pregnancy. COMPARISON:  None available. FINDINGS: Intrauterine gestational sac: Single Yolk sac:  Negative. Embryo:  Negative. Cardiac Activity: Negative. Heart Rate: N/A  bpm MSD:  9.9 mm   5 w   5 d Subchorionic hemorrhage: Small subchorionic hemorrhage without associated mass effect. Maternal uterus/adnexae: Ovaries are normal in appearance bilaterally. No adnexal mass or free fluid within the pelvis. IMPRESSION: 1. Probable early intrauterine gestational sac, but no yolk sac, fetal pole, or cardiac activity yet visualized. Recommend follow-up quantitative B-HCG levels and follow-up US in 14 days to confirm and assess viability. This recommendation follows SRU consensus guidelines: Diagnostic Criteria for Nonviable Pregnancy Early in the First Trimester. [employer] Med 2013; 644:0347-42. 2. Probable small subchorionic hemorrhage without associated mass effect. 3. No other acute maternal uterine or adnexal abnormality identified. Electronically Signed   By: Jeannine Boga M.D.   On: 02/01/2019 03:01   US OB Transvaginal  Result Date: 02/01/2019 CLINICAL DATA:  Initial evaluation for vaginal bleeding, early pregnancy. EXAM: OBSTETRIC <14 WK [place] AND TRANSVAGINAL OB US TECHNIQUE: Both transabdominal and transvaginal ultrasound examinations were performed for complete evaluation of the gestation as well as the maternal uterus, adnexal regions, and pelvic cul-de-sac. Transvaginal technique was performed to assess early pregnancy. COMPARISON:  None available. FINDINGS: Intrauterine gestational sac: Single Yolk sac:  Negative. Embryo:  Negative. Cardiac Activity:  Negative. Heart Rate: N/A  bpm MSD: 9.9 mm   5 w   5 d Subchorionic hemorrhage: Small subchorionic hemorrhage without associated mass effect. Maternal uterus/adnexae: Ovaries are normal in appearance bilaterally. No adnexal mass or free fluid within the pelvis. IMPRESSION: 1. Probable early intrauterine gestational sac, but no yolk sac, fetal pole, or cardiac activity yet visualized. Recommend follow-up quantitative B-HCG levels and follow-up US in 14 days to confirm and assess viability. This recommendation follows SRU consensus guidelines: Diagnostic Criteria for Nonviable Pregnancy Early in the First Trimester. [employer] Med 2013; 595:6387-56. 2. Probable small subchorionic hemorrhage without associated mass effect. 3. No other acute maternal uterine or adnexal abnormality identified. Electronically Signed   By: Jeannine Boga M.D.   On: 02/01/2019 03:01    Procedures Procedures (including critical care time)  Medications Ordered in ED Medications - No data to display  ED Course  I have reviewed the triage vital signs and the nursing notes.  Pertinent labs & imaging results that were available during my care of the patient were reviewed by me and considered in my medical decision making (see chart for details).    MDM Rules/Calculators/A&P                      18 year old female with no chronic medical conditions presenting with vaginal bleeding.  She had a home positive pregnancy test over the last week.  LMP 12/20/18.  Patient informs me that this is her third pregnancy, but it appears that it may be her fourth based on her medical record.  She is having some mild pelvic and abdominal cramping, but no constitutional symptoms.  Urine pregnancy test was obtained, which was positive.  Quantitative hCG was 5644.  UA with no bacteriuria and not concerning for infection.  No electrolyte derangements.  Pelvic ultrasound with probable early intrauterine gestational sac, but no yolk sac, fetal  pole, or cardiac to be yet visualized.  Estimated at 5 weeks and 5 days.  Recommend follow-up quantitative hCG levels and follow-up ultrasound in 14 days to confirm and assess viability.  There is also a probable small subchorionic hemorrhage without associated mass-effect.  No other abnormalities.  His findings were discussed with the patient.  I recommended a pelvic exam, but the  patient was adamant that she did not want to have exam performed at this time.  She would like to follow-up in the clinic with her OB/GYN team.  She has no any concerns for STIs at this time.  I will send a message to the midwife at her OB/GYN practice to close communication loop.  She was advised to start taking prenatal vitamins.  All questions answered.   Doubt ectopic pregnancy.  Could consider threatened abortion versus subchorionic hemorrhage as etiology of the patient's vaginal bleeding.  The patient has been advised to call and schedule a follow-up appointment in 2 to 3 days to have her hCG rechecked.  At this time, she is hemodynamically stable to no acute distress.  Safe for discharge home with outpatient OB/GYN follow-up.  Final Clinical Impression(s) / ED Diagnoses Final diagnoses:  Vaginal bleeding  First trimester pregnancy  Subchorionic hemorrhage of placenta in first trimester, single or unspecified fetus    Rx / DC Orders ED Discharge Orders    None       Barkley BoardsMcDonald, Harmoni Lucus A, PA-C 02/01/19 0447    Marily MemosMesner, Jason, MD 02/01/19 [phone]

## 2019-02-01 NOTE — Discharge Instructions (Signed)
Thank you for allowing me to care for you today in the Emergency Department.   You were seen today in the emergency department for vaginal bleeding.  You had a positive pregnancy test.  You had an ultrasound that showed a pregnancy in the uterus that was approximately 5 weeks and 5 days.  It also showed a small subchorionic hemorrhage.  As we discussed, these can happen spontaneously.  There is no treatment for them.  They can put you at a higher risk for having a miscarriage.  I sent a message to the midwife at your OB/GYN's office to let her know that you should be calling the office for follow-up.  I would recommend calling the office in the morning as they will likely want to repeat your hCG level in 2 to 3 days.  You will also need a repeat ultrasound in approximately 2 weeks.  I would start taking a prenatal vitamin daily.  Return to the emergency department if you develop severe, heavy vaginal bleeding with severe abdominal pain, high fevers, uncontrollable vomiting, or other new, concerning symptoms.

## 2019-02-01 NOTE — ED Triage Notes (Addendum)
Pt presents with c/o of vaginal bleeding that began on Monday at work. Pt states she is ~3mo 1/2 preg with her 3rd child. Pt states she has bled through 2 pads in 24 hours. Pt states she has never experienced any spotting with her other pregnancies and is concerned. Pt denies any N/V/D, any heavy lifting, falls or abdominal injuries. Pt states she has experienced some cramping over the past several days.

## 2019-02-01 NOTE — Telephone Encounter (Signed)
Received message from PA that saw patient at [facility] this morning. Called patient to set up follow up- no answer, left voice message.   Encouraged patient with [place] completed today in ED patient needs follow up HCG on 02/04/19 and to come to MAU for repeat labs, then will have repeat US in 10-14 days.   Callback number left for patient.   Lajean Manes, CNM 02/01/19, 7:14 AM

## 2019-03-03 ENCOUNTER — Emergency Department
Admission: EM | Admit: 2019-03-03 | Discharge: 2019-03-04 | Disposition: A | Discharge status: Home / Self Care | Payer: Medicaid Other | Attending: Emergency Medicine | Admitting: Emergency Medicine

## 2019-03-03 DIAGNOSIS — S6991XA Unspecified injury of right wrist, hand and finger(s), initial encounter: Secondary | ICD-10-CM | POA: Diagnosis present

## 2019-03-03 DIAGNOSIS — X58XXXA Exposure to other specified factors, initial encounter: Secondary | ICD-10-CM | POA: Insufficient documentation

## 2019-03-03 DIAGNOSIS — Y939 Activity, unspecified: Secondary | ICD-10-CM | POA: Insufficient documentation

## 2019-03-03 DIAGNOSIS — Y999 Unspecified external cause status: Secondary | ICD-10-CM | POA: Insufficient documentation

## 2019-03-03 DIAGNOSIS — S61306A Unspecified open wound of right little finger with damage to nail, initial encounter: Secondary | ICD-10-CM | POA: Diagnosis not present

## 2019-03-03 DIAGNOSIS — Y929 Unspecified place or not applicable: Secondary | ICD-10-CM | POA: Insufficient documentation

## 2019-03-03 NOTE — ED Provider Notes (Signed)
[facility] Emergency Department Provider Note   ____________________________________________   First MD Initiated Contact with Patient 03/03/19 2317     (approximate)  I have reviewed the triage vital signs and the nursing notes.   HISTORY  Chief Complaint Finger Injury (Artificial nail ripped off)    HPI Coren Crownover is a 19 y.o. female who presents to the ED from home with a chief complaint of right fingernail injury.  Patient has artificial nails in place and the one on her right fifth digit was partially ripped backwards and taken the real nail with it.  Voices no other complaints or injuries.       Past Medical History:  Diagnosis Date  . Encounter for supervision of normal pregnancy, unspecified, unspecified trimester 09/15/2017      Nursing Staff Provider Office Location  [facility] Dating  19 wk [place] Language  English Anatomy US  WNL Flu Vaccine  09/15/17 Genetic Screen  declined TDaP vaccine   11/10/17 Hgb A1C or  GTT Third trimester nl 2 hour Rhogam  n/a   LAB RESULTS  Feeding Plan Breast Blood Type A/Positive/-- (08/14 1538)  Contraception PP IUD  Antibody Negative (08/14 1538) Circumcision Yes if a boy Rubella 3.51 (08/14  . Tonsillitis     Patient Active Problem List   Diagnosis Date Noted  . Supervision of other normal pregnancy, antepartum 09/01/2018  . Nausea and vomiting during pregnancy 09/01/2018  . MDD (major depressive disorder), single episode, severe , no psychosis ([place]) 04/05/2015    Past Surgical History:  Procedure Laterality Date  . TONSILLECTOMY      Prior to Admission medications   Not on File    Allergies Citric acid  Family History  Problem Relation Age of Onset  . Diabetes Maternal Grandmother     Social History Social History   Tobacco Use  . Smoking status: Never Smoker  . Smokeless tobacco: Never Used  Substance Use Topics  . Alcohol use: Yes  . Drug use: No    Review of  Systems  Constitutional: No fever/chills Eyes: No visual changes. ENT: No sore throat. Cardiovascular: Denies chest pain. Respiratory: Denies shortness of breath. Gastrointestinal: No abdominal pain.  No nausea, no vomiting.  No diarrhea.  No constipation. Genitourinary: Negative for dysuria. Musculoskeletal: Positive for right fifth digit fingernail injury.  Negative for back pain. Skin: Negative for rash. Neurological: Negative for headaches, focal weakness or numbness.   ____________________________________________   PHYSICAL EXAM:  VITAL SIGNS: ED Triage Vitals  Enc Vitals Group     BP 03/03/19 2226 129/82     Pulse Rate 03/03/19 2226 84     Resp 03/03/19 2226 18     Temp 03/03/19 2226 99.3 F (37.4 C)     Temp Source 03/03/19 2226 Oral     SpO2 03/03/19 2226 97 %     Weight 03/03/19 2227 152 lb (68.9 kg)     Height 03/03/19 2227 5\' 1"  (1.549 m)     Head Circumference --      Peak Flow --      Pain Score 03/03/19 2227 7     Pain Loc --      Pain Edu? --      Excl. in [place]? --     Constitutional: Alert and oriented. Well appearing and in no acute distress. Eyes: Conjunctivae are normal. PERRL. EOMI. Head: Atraumatic. Nose: No congestion/rhinnorhea. Mouth/Throat: Mucous membranes are moist.  Oropharynx non-erythematous. Neck: No stridor.  Cardiovascular: Normal rate, regular rhythm. Grossly normal heart sounds.  Good peripheral circulation. Respiratory: Normal respiratory effort.  No retractions. Lungs CTAB. Gastrointestinal: Soft and nontender. No distention. No abdominal bruits. No CVA tenderness. Musculoskeletal:  Right fifth digit: Artificial nail adhered firmly to real nail which is almost completely avulsed.  No active bleeding.  2+ radial pulse.   No lower extremity tenderness nor edema.  No joint effusions. Neurologic:  Normal speech and language. No gross focal neurologic deficits are appreciated. No gait instability. Skin:  Skin is warm, dry and intact.  No rash noted. Psychiatric: Mood and affect are normal. Speech and behavior are normal.  ____________________________________________   LABS (all labs ordered are listed, but only abnormal results are displayed)  Labs Reviewed - No data to display ____________________________________________  EKG  None ____________________________________________  RADIOLOGY  ED MD interpretation:  None  Official radiology report(s): No results found.  ____________________________________________   PROCEDURES  Procedure(s) performed (including Critical Care):  Procedures   ____________________________________________   INITIAL IMPRESSION / ASSESSMENT AND PLAN / ED COURSE  As part of my medical decision making, I reviewed the following data within the electronic MEDICAL RECORD NUMBER Nursing notes reviewed and incorporated, Old chart reviewed and Notes from prior ED visits     Lyndi Holbein was evaluated in Emergency Department on 03/03/2019 for the symptoms described in the history of present illness. She was evaluated in the context of the global COVID-19 pandemic, which necessitated consideration that the patient might be at risk for infection with the SARS-CoV-2 virus that causes COVID-19. Institutional protocols and algorithms that pertain to the evaluation of patients at risk for COVID-19 are in a state of rapid change based on information released by regulatory bodies including the CDC and federal and state organizations. These policies and algorithms were followed during the patient's care in the ED.    19 year old female who presents with right fifth digit nail avulsion.  Discussed with patient that the real nail is almost completely avulsed.  Would recommend keeping the nail on to protect the nailbed as her new nail grows in.  She will use a cuticle remover to lightly dislodged her artificial nail from her cuticle.  She will keep her wound clean and dry and expect her new nail to  grow in wavy.  Strict return precautions given.  Patient verbalizes understanding and agrees with plan of care.      ____________________________________________   FINAL CLINICAL IMPRESSION(S) / ED DIAGNOSES  Final diagnoses:  Injury to fingernail of right hand, initial encounter     ED Discharge Orders    None       Note:  This document was prepared using Dragon voice recognition software and may include unintentional dictation errors.   Irean Hong, MD 03/04/19 [phone]

## 2019-03-03 NOTE — Discharge Instructions (Signed)
Keep wound clean and dry.  Your new nail will likely grow back wavy.  Return to the ER for worsening symptoms, increased redness/swelling, purulent discharge or other concerns.

## 2019-03-03 NOTE — ED Triage Notes (Signed)
Patient to ED for right pinkie finger injury. Has artificial nail in place that has been ripped backwards and taken part of the real nail with it. Small amount of bleeding, controlled.

## 2019-03-03 NOTE — ED Notes (Signed)
Finger clean with sterile saline and surgiscrub. Clean bandage applied.

## 2019-03-04 NOTE — ED Notes (Signed)
Reviewed discharge instructions, follow-up care, and wound/bandage care with patient. Patient verbalized understanding of all information reviewed. Patient stable, with no distress noted at this time.

## 2019-05-25 ENCOUNTER — Ambulatory Visit: Payer: Medicaid Other | Attending: Internal Medicine

## 2019-05-25 DIAGNOSIS — Z20822 Contact with and (suspected) exposure to covid-19: Secondary | ICD-10-CM

## 2019-05-27 LAB — SARS-COV-2, NAA 2 DAY TAT

## 2019-05-27 LAB — NOVEL CORONAVIRUS, NAA: SARS-CoV-2, NAA: NOT DETECTED

## 2019-06-01 ENCOUNTER — Other Ambulatory Visit: Payer: Medicaid Other

## 2019-06-02 ENCOUNTER — Ambulatory Visit: Payer: Medicaid Other | Attending: Internal Medicine

## 2019-06-02 ENCOUNTER — Other Ambulatory Visit: Payer: Medicaid Other

## 2019-06-02 DIAGNOSIS — Z20822 Contact with and (suspected) exposure to covid-19: Secondary | ICD-10-CM

## 2019-06-03 LAB — NOVEL CORONAVIRUS, NAA: SARS-CoV-2, NAA: NOT DETECTED

## 2019-06-03 LAB — SARS-COV-2, NAA 2 DAY TAT

## 2019-09-15 ENCOUNTER — Emergency Department (HOSPITAL_BASED_OUTPATIENT_CLINIC_OR_DEPARTMENT_OTHER): Payer: Medicaid Other

## 2019-09-15 ENCOUNTER — Other Ambulatory Visit: Payer: Self-pay

## 2019-09-15 ENCOUNTER — Encounter (HOSPITAL_BASED_OUTPATIENT_CLINIC_OR_DEPARTMENT_OTHER): Payer: Self-pay | Admitting: *Deleted

## 2019-09-15 DIAGNOSIS — Y939 Activity, unspecified: Secondary | ICD-10-CM | POA: Insufficient documentation

## 2019-09-15 DIAGNOSIS — S60212A Contusion of left wrist, initial encounter: Secondary | ICD-10-CM | POA: Diagnosis not present

## 2019-09-15 DIAGNOSIS — W19XXXA Unspecified fall, initial encounter: Secondary | ICD-10-CM | POA: Insufficient documentation

## 2019-09-15 DIAGNOSIS — Y92009 Unspecified place in unspecified non-institutional (private) residence as the place of occurrence of the external cause: Secondary | ICD-10-CM | POA: Diagnosis not present

## 2019-09-15 DIAGNOSIS — Y999 Unspecified external cause status: Secondary | ICD-10-CM | POA: Insufficient documentation

## 2019-09-15 DIAGNOSIS — Z3A22 22 weeks gestation of pregnancy: Secondary | ICD-10-CM | POA: Diagnosis not present

## 2019-09-15 DIAGNOSIS — O9A212 Injury, poisoning and certain other consequences of external causes complicating pregnancy, second trimester: Secondary | ICD-10-CM | POA: Diagnosis present

## 2019-09-15 NOTE — ED Triage Notes (Addendum)
Pt c/o left wrist injury x 1 hr ago  , pt is 22 weeks preg

## 2019-09-16 ENCOUNTER — Emergency Department (HOSPITAL_BASED_OUTPATIENT_CLINIC_OR_DEPARTMENT_OTHER)
Admission: EM | Admit: 2019-09-16 | Discharge: 2019-09-16 | Disposition: A | Discharge status: Home / Self Care | Payer: Medicaid Other | Attending: Emergency Medicine | Admitting: Emergency Medicine

## 2019-09-16 DIAGNOSIS — S60212A Contusion of left wrist, initial encounter: Secondary | ICD-10-CM

## 2019-09-16 DIAGNOSIS — Z3492 Encounter for supervision of normal pregnancy, unspecified, second trimester: Secondary | ICD-10-CM

## 2019-09-16 DIAGNOSIS — W19XXXA Unspecified fall, initial encounter: Secondary | ICD-10-CM

## 2019-09-16 NOTE — ED Provider Notes (Signed)
[facility] EMERGENCY DEPARTMENT Provider Note   CSN: [id number] Arrival date & time: 09/15/19  2332   History Chief Complaint  Patient presents with  . Wrist Injury    Barbara Kennedy is a 19 y.o. female.  The history is provided by the patient.  Wrist Injury She is pregnant at [redacted] weeks gestation, and fell at home injuring her left wrist.  She states that she has been vomiting and was sitting on the edge of her tub when she got lightheaded and evidently fell.  She denies other injury.  There have been no complications of pregnancy other than persistent nausea and vomiting.  Past Medical History:  Diagnosis Date  . Encounter for supervision of normal pregnancy, unspecified, unspecified trimester 09/15/2017      Nursing Staff Provider Office Location  [facility] Dating  19 wk [place] Language  English Anatomy US  WNL Flu Vaccine  09/15/17 Genetic Screen  declined TDaP vaccine   11/10/17 Hgb A1C or  GTT Third trimester nl 2 hour Rhogam  n/a   LAB RESULTS  Feeding Plan Breast Blood Type A/Positive/-- (08/14 1538)  Contraception PP IUD  Antibody Negative (08/14 1538) Circumcision Yes if a boy Rubella 3.51 (08/14  . Tonsillitis     Patient Active Problem List   Diagnosis Date Noted  . Supervision of other normal pregnancy, antepartum 09/01/2018  . Nausea and vomiting during pregnancy 09/01/2018  . MDD (major depressive disorder), single episode, severe , no psychosis (HCC) 04/05/2015    Past Surgical History:  Procedure Laterality Date  . TONSILLECTOMY       OB History    Gravida  4   Para  2   Term  2   Preterm      AB      Living  2     SAB      TAB      Ectopic      Multiple  0   Live Births  2           Family History  Problem Relation Age of Onset  . Diabetes Maternal Grandmother     Social History   Tobacco Use  . Smoking status: Never Smoker  . Smokeless tobacco: Never Used  Vaping Use  . Vaping Use: Never used  Substance Use  Topics  . Alcohol use: Yes  . Drug use: No    Home Medications Prior to Admission medications   Not on File    Allergies    Citric acid  Review of Systems   Review of Systems  All other systems reviewed and are negative.   Physical Exam Updated Vital Signs BP 134/66   Pulse 82   Temp 98.5 F (36.9 C) (Oral)   Resp 18   Ht 5\' 1"  (1.549 m)   Wt 72.6 kg   LMP 04/26/2019   SpO2 99%   BMI 30.23 kg/m   Physical Exam Vitals and nursing note reviewed.   19 year old female, resting comfortably and in no acute distress. Vital signs are normal. Oxygen saturation is 99%, which is normal. Head is normocephalic and atraumatic. PERRLA, EOMI. Oropharynx is clear. Neck is nontender and supple without adenopathy or JVD. Back is nontender and there is no CVA tenderness. Lungs are clear without rales, wheezes, or rhonchi. Chest is nontender. Heart has regular rate and rhythm without murmur. Abdomen is soft, flat, nontender without masses or hepatosplenomegaly and peristalsis is normoactive. Extremities: There is soft tissue swelling  and ecchymosis over the ulnar aspect of the distal left forearm, tenderness present over the same area.  Full passive range of motion is present of the left wrist.  Distal neurovascular exam is intact with prompt capillary refill, normal sensation, normal use of the intrinsic muscles of the hand. Skin is warm and dry without rash. Neurologic: Mental status is normal, cranial nerves are intact, there are no motor or sensory deficits.  ED Results / Procedures / Treatments    Radiology DG Wrist Complete Left  Result Date: 09/16/2019 CLINICAL DATA:  Fall tonight.  Left wrist pain and deformity. EXAM: LEFT WRIST - COMPLETE 3+ VIEW COMPARISON:  None. FINDINGS: There is no evidence of fracture or dislocation. The scaphoid is intact. Normal joint spaces and alignment. Large amount of soft tissue edema about the ulnar aspect of the wrist. IMPRESSION: Soft tissue  edema without acute fracture or dislocation. Electronically Signed   By: Narda Rutherford M.D.   On: 09/16/2019 00:12    Procedures Procedures   Medications Ordered in ED Medications - No data to display  ED Course  I have reviewed the triage vital signs and the nursing notes.  Pertinent imaging results that were available during my care of the patient were reviewed by me and considered in my medical decision making (see chart for details).  MDM Rules/Calculators/A&P Fall with injury to left wrist.  X-rays are negative for fracture.  She is placed in a wrists splint to use as needed, advised on ice and elevation.  Told to use acetaminophen for pain.  Follow-up with hand surgery if not healing appropriately.  Old records are reviewed confirming patient having prenatal care.  Final Clinical Impression(s) / ED Diagnoses Final diagnoses:  Fall in home, initial encounter  Contusion of left wrist, initial encounter  Second trimester pregnancy    Rx / DC Orders ED Discharge Orders    None       Dione Booze, MD 09/16/19 [id number]

## 2019-09-16 NOTE — Discharge Instructions (Signed)
Wear the wrist brace as needed.  Apply ice for 30 minutes at a time, 4 times a day.  Take acetaminophen as needed for pain.  Do not take ibuprofen or naproxen, they can potentially harm your baby.

## 2019-09-17 ENCOUNTER — Emergency Department (HOSPITAL_BASED_OUTPATIENT_CLINIC_OR_DEPARTMENT_OTHER)
Admission: EM | Admit: 2019-09-17 | Discharge: 2019-09-17 | Disposition: A | Discharge status: Home / Self Care | Payer: Medicaid Other | Attending: Emergency Medicine | Admitting: Emergency Medicine

## 2019-09-17 ENCOUNTER — Encounter (HOSPITAL_BASED_OUTPATIENT_CLINIC_OR_DEPARTMENT_OTHER): Payer: Self-pay

## 2019-09-17 ENCOUNTER — Other Ambulatory Visit: Payer: Self-pay

## 2019-09-17 DIAGNOSIS — R238 Other skin changes: Secondary | ICD-10-CM | POA: Diagnosis not present

## 2019-09-17 DIAGNOSIS — Z5321 Procedure and treatment not carried out due to patient leaving prior to being seen by health care provider: Secondary | ICD-10-CM | POA: Diagnosis not present

## 2019-09-17 NOTE — ED Triage Notes (Addendum)
Pt c/o vesicular rash on bilateral legs. Pt taken loratidine and benadryl without relief. Pt is [redacted] week pregnant.

## 2020-04-08 IMAGING — US US OB COMP LESS 14 WK
1 series · 13 of 28 positions shown · non-contrast
Comparison: None available.

CLINICAL DATA: Initial evaluation for vaginal bleeding, early
pregnancy.

EXAM:
OBSTETRIC <14 WK US AND TRANSVAGINAL OB US
TECHNIQUE: Both transabdominal and transvaginal ultrasound examinations were
performed for complete evaluation of the gestation as well as the
maternal uterus, adnexal regions, and pelvic cul-de-sac.
Transvaginal technique was performed to assess early pregnancy.

[Series 1: us ob comp less 14 wk · 52 acquisitions, 13 frames shown]
[im 2/52]
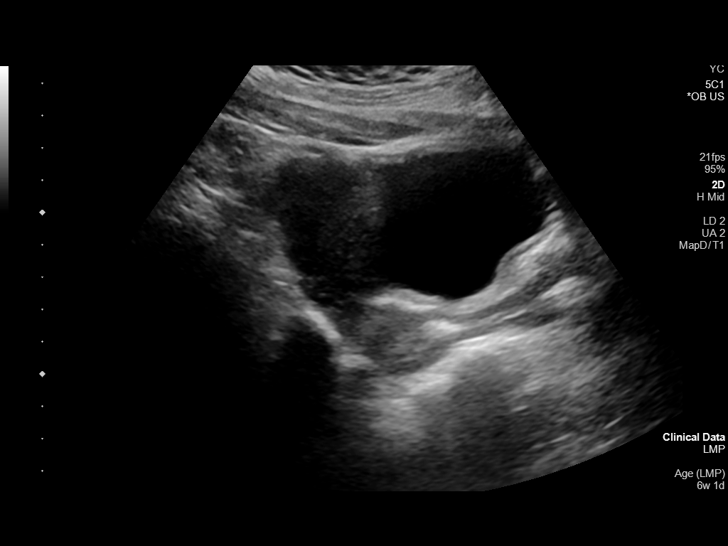
[im 6/52]
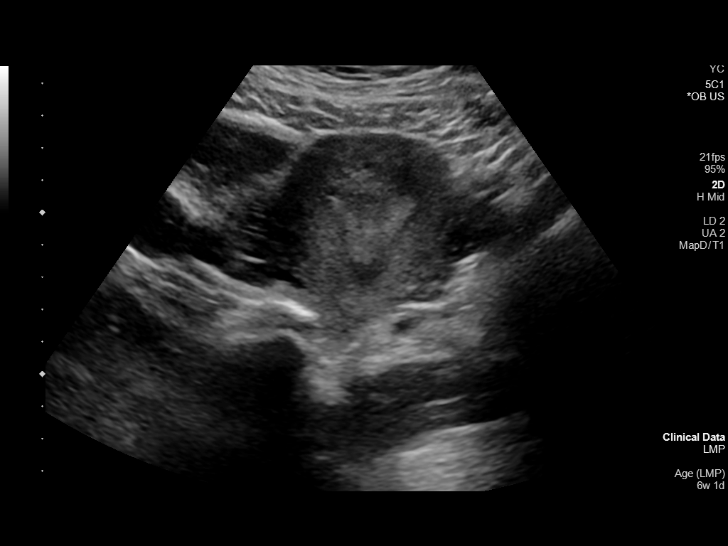
[im 10/52]
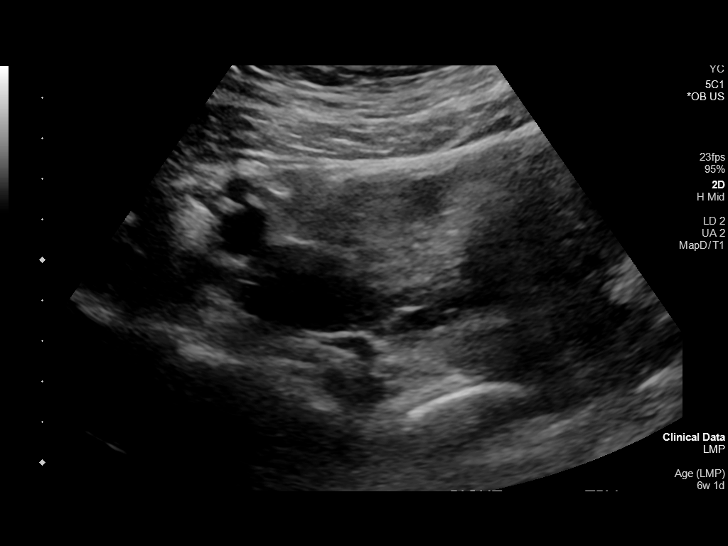
[im 14/52]
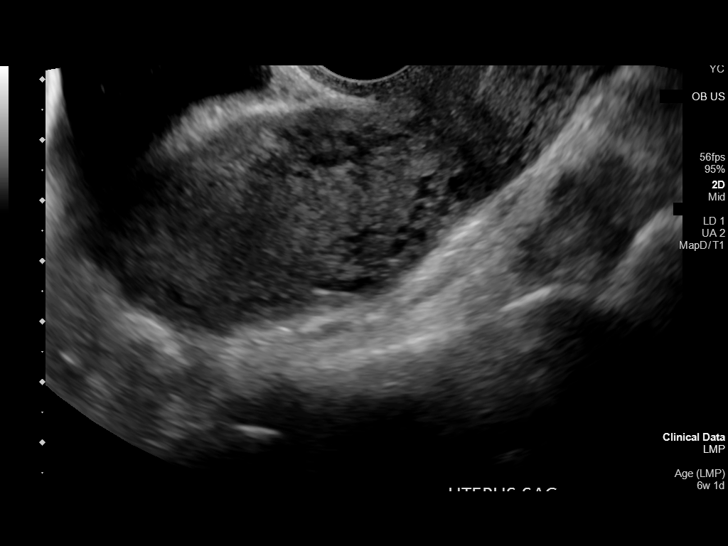
[im 18/52]
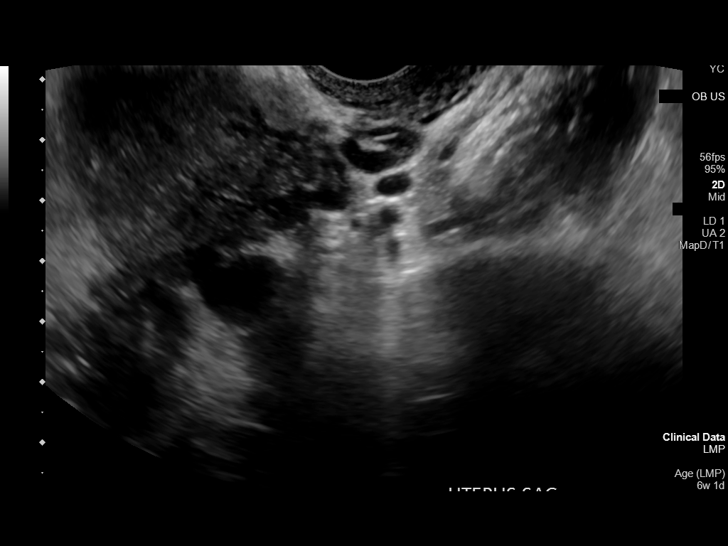
[im 21/52]
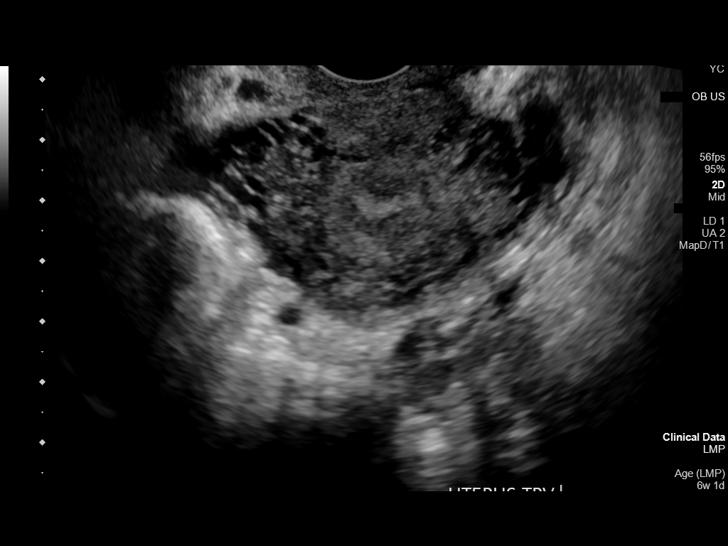
[im 27/52]
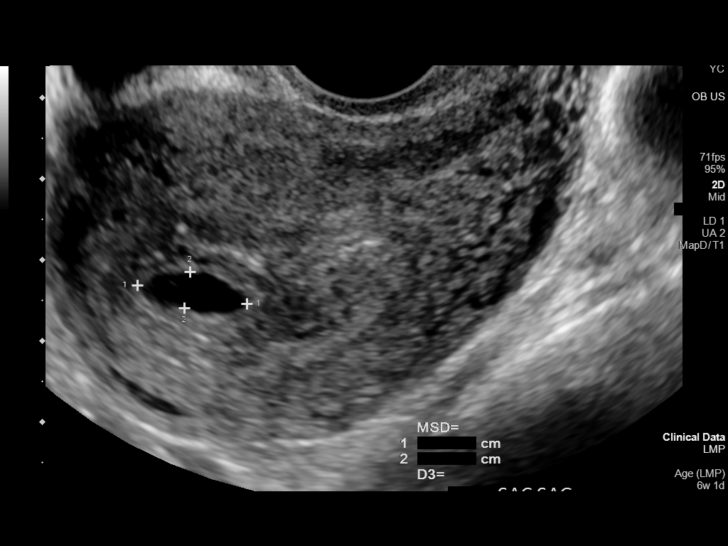
[im 31/52]
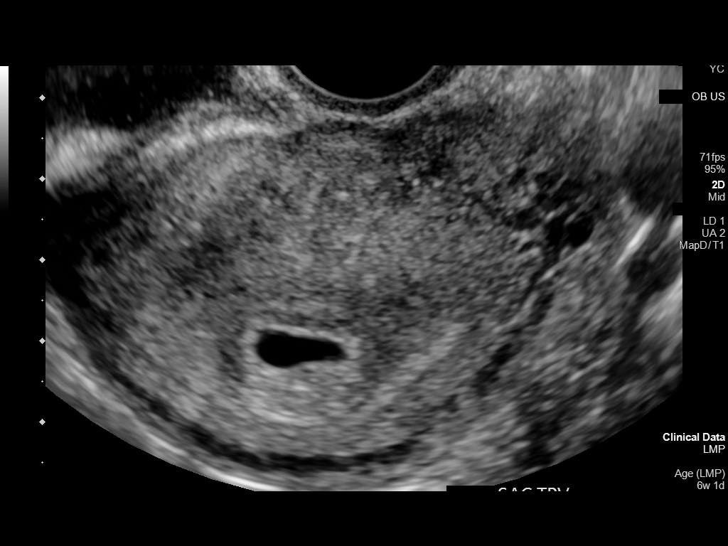
[im 35/52]
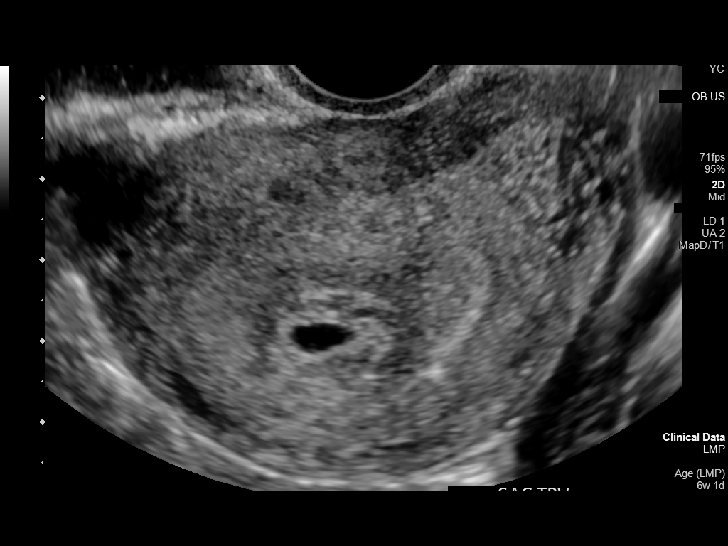
[im 38/52]
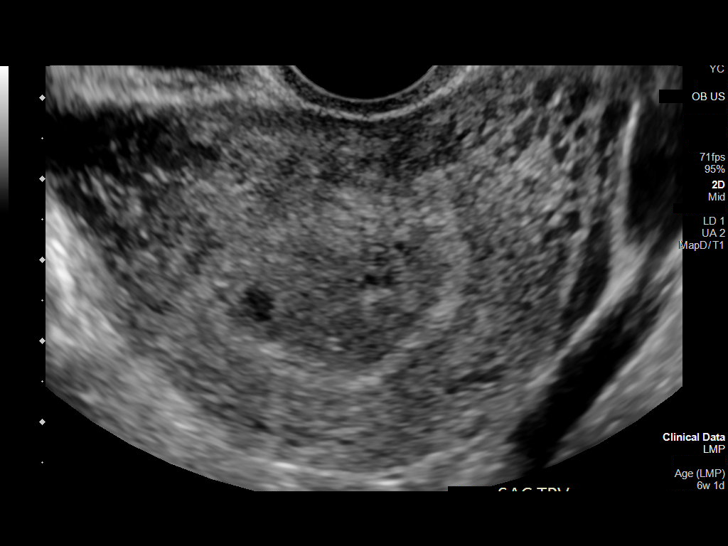
[im 42/52]
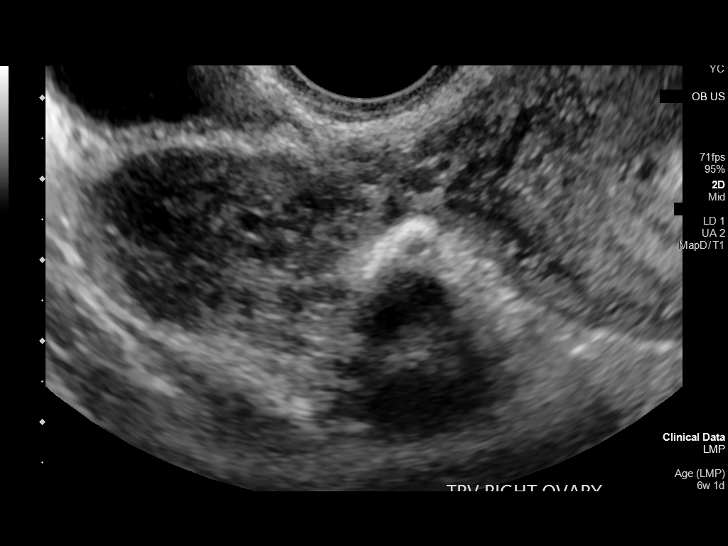
[im 46/52]
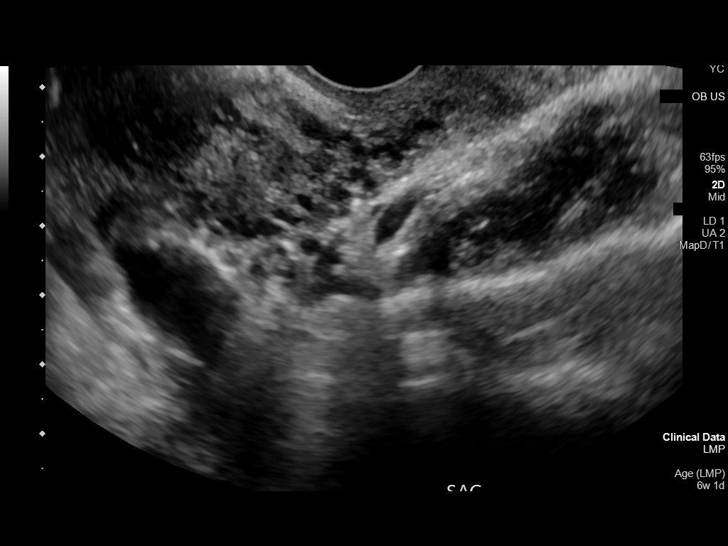
[im 50/52]
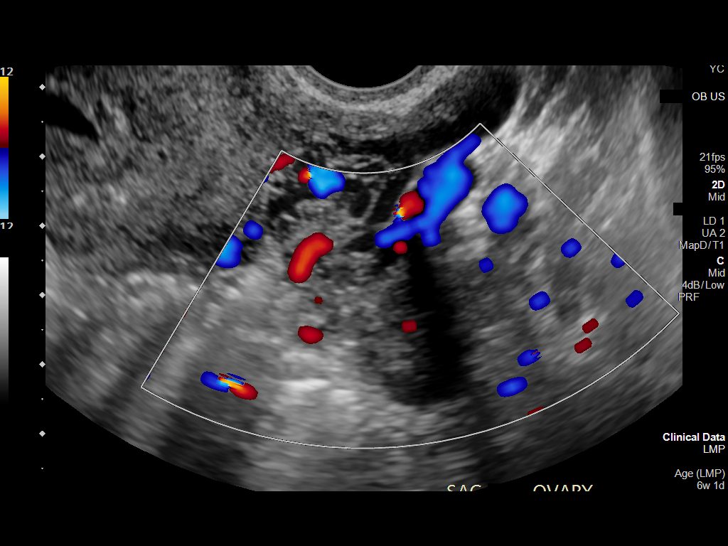

[13 of 28 positions shown; findings below may reference images not displayed]

FINDINGS: Intrauterine gestational sac: Single

Yolk sac:  Negative.

Embryo:  Negative.

Cardiac Activity: Negative.

Heart Rate: N/A  bpm

MSD: 9.9 mm   5 w   5 d

Subchorionic hemorrhage: Small subchorionic hemorrhage without
associated mass effect.

Maternal uterus/adnexae: Ovaries are normal in appearance
bilaterally. No adnexal mass or free fluid within the pelvis.
IMPRESSION: 1. Probable early intrauterine gestational sac, but no yolk sac,
fetal pole, or cardiac activity yet visualized. Recommend follow-up
quantitative B-HCG levels and follow-up US in 14 days to confirm and
assess viability. This recommendation follows SRU consensus
guidelines: Diagnostic Criteria for Nonviable Pregnancy Early in the
First Trimester. N Engl J Med 1329; [DATE].
2. Probable small subchorionic hemorrhage without associated mass
effect.
3. No other acute maternal uterine or adnexal abnormality
identified.

## 2020-07-14 ENCOUNTER — Other Ambulatory Visit: Payer: Self-pay

## 2020-07-14 ENCOUNTER — Encounter (HOSPITAL_COMMUNITY): Payer: Self-pay | Admitting: *Deleted

## 2020-07-14 ENCOUNTER — Inpatient Hospital Stay (HOSPITAL_COMMUNITY)
Admission: AD | Admit: 2020-07-14 | Discharge: 2020-07-14 | Disposition: A | Discharge status: Home / Self Care | Payer: Medicaid Other | Attending: Obstetrics and Gynecology | Admitting: Obstetrics and Gynecology

## 2020-07-14 ENCOUNTER — Inpatient Hospital Stay (HOSPITAL_BASED_OUTPATIENT_CLINIC_OR_DEPARTMENT_OTHER): Payer: Medicaid Other

## 2020-07-14 DIAGNOSIS — Z3A24 24 weeks gestation of pregnancy: Secondary | ICD-10-CM

## 2020-07-14 DIAGNOSIS — R300 Dysuria: Secondary | ICD-10-CM | POA: Insufficient documentation

## 2020-07-14 DIAGNOSIS — O4692 Antepartum hemorrhage, unspecified, second trimester: Secondary | ICD-10-CM

## 2020-07-14 DIAGNOSIS — O26892 Other specified pregnancy related conditions, second trimester: Secondary | ICD-10-CM | POA: Diagnosis present

## 2020-07-14 DIAGNOSIS — O26852 Spotting complicating pregnancy, second trimester: Secondary | ICD-10-CM | POA: Insufficient documentation

## 2020-07-14 DIAGNOSIS — Z3689 Encounter for other specified antenatal screening: Secondary | ICD-10-CM | POA: Diagnosis not present

## 2020-07-14 DIAGNOSIS — R102 Pelvic and perineal pain: Secondary | ICD-10-CM | POA: Diagnosis not present

## 2020-07-14 LAB — URINALYSIS, ROUTINE W REFLEX MICROSCOPIC
Bilirubin Urine: NEGATIVE
Glucose, UA: NEGATIVE mg/dL
Hgb urine dipstick: NEGATIVE
Ketones, ur: NEGATIVE mg/dL
Leukocytes,Ua: NEGATIVE
Nitrite: NEGATIVE
Protein, ur: NEGATIVE mg/dL
Specific Gravity, Urine: 1.017 (ref 1.005–1.030)
pH: 8 (ref 5.0–8.0)

## 2020-07-14 MED ORDER — BETAMETHASONE SOD PHOS & ACET 6 (3-3) MG/ML IJ SUSP
12.0000 mg | Freq: Once | INTRAMUSCULAR | Status: AC
  Administered 2020-07-14: 12 mg via INTRAMUSCULAR
  Filled 2020-07-14: qty 5

## 2020-07-14 NOTE — MAU Provider Note (Signed)
History     CSN: [id number]  Arrival date and time: 07/14/20 1823   Event Date/Time   First Provider Initiated Contact with Patient 07/14/20 2104      Chief Complaint  Patient presents with   Dysuria   Vaginal Bleeding   Barbara Kennedy is a 20 y.o. [id number] at [redacted]w[redacted]d who receives care at [facility].  She presents today for Vaginal Bleeding.  She reports she increased urinary frequency and the "last few times I went I had clear discharge with blood in it." She states it started around 4 or 5 pm.  She denies blood in her underwear.  She states the blood is "a bright red." She states she called her primary ob and was instructed to come in for evaluation because of her history of PTD.  She reports she is not receiving Makena injections. She endorses fetal movement and denies abdominal cramping or contractions.  She endorses sexual activity yesterday that was without pain or discomfort.     OB History     Gravida  5   Para  3   Term  2   Preterm  1   AB  1   Living  3      SAB  1   IAB      Ectopic      Multiple  0   Live Births  3           Past Medical History:  Diagnosis Date   Encounter for supervision of normal pregnancy, unspecified, unspecified trimester 09/15/2017      Nursing Staff Provider Office Location  [facility] Dating  19 wk [place] Language  English Anatomy US  WNL Flu Vaccine  09/15/17 Genetic Screen  declined TDaP vaccine   11/10/17 Hgb A1C or  GTT Third trimester nl 2 hour Rhogam  n/a   LAB RESULTS  Feeding Plan Breast Blood Type A/Positive/-- (08/14 1538)  Contraception PP IUD  Antibody Negative (08/14 1538) Circumcision Yes if a boy Rubella 3.51 (08/14   Tonsillitis     Past Surgical History:  Procedure Laterality Date   TONSILLECTOMY      Family History  Problem Relation Age of Onset   Diabetes Maternal Grandmother     Social History   Tobacco Use   Smoking status: Never   Smokeless tobacco: Never  Vaping Use   Vaping  Use: Never used  Substance Use Topics   Alcohol use: Yes   Drug use: No    Allergies:  Allergies  Allergen Reactions   Citric Acid Rash    Around mouth, chin area    No medications prior to admission.    Review of Systems  Constitutional:  Negative for chills and fever.  Gastrointestinal:  Negative for abdominal pain, nausea and vomiting.  Genitourinary:  Positive for vaginal bleeding and vaginal discharge. Negative for difficulty urinating, dyspareunia and dysuria.  Musculoskeletal:  Negative for back pain.  Neurological:  Negative for dizziness, light-headedness and headaches.  Physical Exam   Blood pressure 106/62, pulse 85, temperature 98.1 F (36.7 C), resp. rate 18, height 5\' 1"  (1.549 m), weight 80.3 kg, SpO2 99 %, unknown if currently breastfeeding.  Physical Exam Vitals reviewed. Exam conducted with a chaperone present.  Constitutional:      Appearance: Normal appearance.  HENT:     Head: Normocephalic and atraumatic.  Eyes:     Conjunctiva/sclera: Conjunctivae normal.  Cardiovascular:     Rate and Rhythm: Normal rate.  Pulmonary:  Effort: Pulmonary effort is normal. No respiratory distress.  Abdominal:     General: Bowel sounds are normal.     Palpations: Abdomen is soft.  Genitourinary:    Comments: Speculum Exam: -Normal External Genitalia: Non tender, no apparent discharge at introitus.  -Vaginal Vault: Pink mucosa with good rugae. Moderate amt cloudy mucoid discharge -Cervix:Pink, no lesions, cysts, or polyps.  Appears open. No active bleeding, but copious amount cloudy mucoid discharge streaked with bright red blood noted.   -Bimanual Exam: Dilation: Anterior  Position: 2/Thick/ Fetal Parts Appreciated Exam by:: Gerrit Heck, CNM   Musculoskeletal:        General: Normal range of motion.     Cervical back: Normal range of motion.  Skin:    General: Skin is warm and dry.  Neurological:     Mental Status: She is alert and oriented to person,  place, and time.  Psychiatric:        Mood and Affect: Mood normal.        Behavior: Behavior normal.        Thought Content: Thought content normal.    Fetal Assessment 150 bpm, Mod Var, -Decels, -Accels Toco: NO ctx or irritability graphed  MAU Course   Results for orders placed or performed during the hospital encounter of 07/14/20 (from the past 24 hour(s))  Urinalysis, Routine w reflex microscopic Urine, Clean Catch     Status: None   Collection Time: 07/14/20  8:41 PM  Result Value Ref Range   Color, Urine YELLOW YELLOW   APPearance CLEAR CLEAR   Specific Gravity, Urine 1.017 1.005 - 1.030   pH 8.0 5.0 - 8.0   Glucose, UA NEGATIVE NEGATIVE mg/dL   Hgb urine dipstick NEGATIVE NEGATIVE   Bilirubin Urine NEGATIVE NEGATIVE   Ketones, ur NEGATIVE NEGATIVE mg/dL   Protein, ur NEGATIVE NEGATIVE mg/dL   Nitrite NEGATIVE NEGATIVE   Leukocytes,Ua NEGATIVE NEGATIVE      MDM PE Labs: UA EFM Consult Limited US Steroid Injection Assessment and Plan  20 year old [id number]  SIUP at 24.4 weeks Cat I FT Vaginal Spotting Preterm Dilation  -POC Reviewed. -Patient declines STD and VI testing. -Informed that bleeding is likely from recent sexual intercourse.  Educated on how sperm can cause changes in vaginal discharge.  Further educated on how sexual activity can be traumatic to sensitive cervix.  -Exam performed and findings discussed. -Provider informs patient of dilation and concerns. -Will send for [place] and await results.    Cherre Robins MSN, CNM 07/14/2020, 9:05 PM    Reassessment (10:07 PM) -[place] returns negative for abruption or previa. -AFI normal volume -Will give betamethasone and discharge to home with follow up at primary provider.  -Dr. Chana Bode consulted and informed of patient status, evaluation, interventions, results, and POC. Advised: *Agrees with plan. *Patient to seek care at anticipated delivery hospital. -Provider to bedside to discuss POC and  recommendations. -Patient educated on BMZ including what it is and why we use it.   -Discussed pelvic rest and need to follow up with primary ob in 24-36 hours for next BMZ injection. -Patient also advised to consider STD testing. -Patient verbalizes understanding and without questions or concerns. -Encouraged to call primary ob or return to MAU if symptoms worsen or with the onset of new symptoms. -Discharged to home in stable condition.  Cherre Robins MSN, CNM Advanced Practice Provider, [facility] [employer]

## 2020-07-14 NOTE — ED Triage Notes (Signed)
The pt is pregant she does not remember when her last period was she is having some vaginal  bleeding light  she thinks she has a uti  edc sept 28th

## 2020-07-14 NOTE — ED Provider Notes (Signed)
Emergency Medicine Provider OB Triage Evaluation Note  Barbara Kennedy is a 20 y.o. female, G5P2002, at  [redacted] weeks gestation who presents to the emergency department with complaints of lower abdominal pelvic pressure and vaginal bleeding.  She is typically followed by [facility].  Has not been seen recently.  She also admits to urinary frequency and dysuria.  No flank pain.  No emesis.  Review of  Systems  Positive: Pelvic pain, vaginal discharge, vaginal bleeding, urinary frequency Negative: Fever, emesis  Physical Exam  BP 119/74 (BP Location: Left Arm)   Pulse 82   Temp 98 F (36.7 C) (Oral)   Resp 16   Ht 5\' 1"  (1.549 m)   Wt 74.8 kg   LMP  (LMP Unknown)   SpO2 99%   BMI 31.16 kg/m  General: Awake, no distress  HEENT: Atraumatic  Resp: Normal effort  Cardiac: Normal rate Abd: Nondistended, nontender, gravid abdomen MSK: Moves all extremities without difficulty Neuro: Speech clear  Medical Decision Making  Pt evaluated for pregnancy concern and is stable for transfer to MAU. Pt is in agreement with plan for transfer.  7:39 PM Discussed with MAU APP, who accepts patient in transfer.  Clinical Impression  Vaginal bleeding in pregnancy     Leida Luton A, PA-C 07/14/20 09/13/20, MD 07/14/20 2059

## 2020-07-14 NOTE — MAU Note (Signed)
Transferred from [facility]. Pt c/o urinary frequency no burning or pain. Reports some spotting this afternoon when she wiped. Last intercourse last night.
